# Patient Record
Sex: Male | Born: 1955
Health system: Southern US, Community
[De-identification: ages and names within clinical notes are randomized; demographics above are authoritative.]

## PROBLEM LIST (undated history)

## (undated) DIAGNOSIS — K859 Acute pancreatitis without necrosis or infection, unspecified: Secondary | ICD-10-CM

## (undated) DIAGNOSIS — G709 Myoneural disorder, unspecified: Secondary | ICD-10-CM

## (undated) DIAGNOSIS — T7840XA Allergy, unspecified, initial encounter: Secondary | ICD-10-CM

## (undated) DIAGNOSIS — G600 Hereditary motor and sensory neuropathy: Secondary | ICD-10-CM

## (undated) HISTORY — DX: Acute pancreatitis without necrosis or infection, unspecified: K85.90

## (undated) HISTORY — DX: Myoneural disorder, unspecified: G70.9

## (undated) HISTORY — PX: HERNIA REPAIR: SHX51

## (undated) HISTORY — PX: HERNIA MESH REMOVAL: SHX1745

## (undated) HISTORY — DX: Hereditary motor and sensory neuropathy: G60.0

## (undated) HISTORY — DX: Allergy, unspecified, initial encounter: T78.40XA

## (undated) HISTORY — PX: OTHER SURGICAL HISTORY: SHX169

## (undated) HISTORY — PX: VASECTOMY: SHX75

---

## 2004-01-23 ENCOUNTER — Emergency Department (HOSPITAL_COMMUNITY): Admission: EM | Admit: 2004-01-23 | Discharge: 2004-01-23 | Payer: Self-pay | Admitting: Emergency Medicine

## 2009-02-03 ENCOUNTER — Ambulatory Visit: Payer: Self-pay | Admitting: Family Medicine

## 2009-02-03 ENCOUNTER — Inpatient Hospital Stay (HOSPITAL_COMMUNITY): Admission: EM | Admit: 2009-02-03 | Discharge: 2009-02-06 | Payer: Self-pay | Admitting: Emergency Medicine

## 2009-11-24 IMAGING — US US ABDOMEN COMPLETE
1 series · 14 of 25 positions shown · non-contrast
Comparison: None

CLINICAL DATA: Abdominal pain.  Right upper quadrant pain.

ABDOMEN ULTRASOUND
TECHNIQUE: Complete abdominal ultrasound examination was performed
including evaluation of the liver, gallbladder, bile ducts,
pancreas, kidneys, spleen, IVC, and abdominal aorta.

[Series 1: us abdomen complete · 0.28mm/px · 14 of 54 slices shown]
[im 1/54]
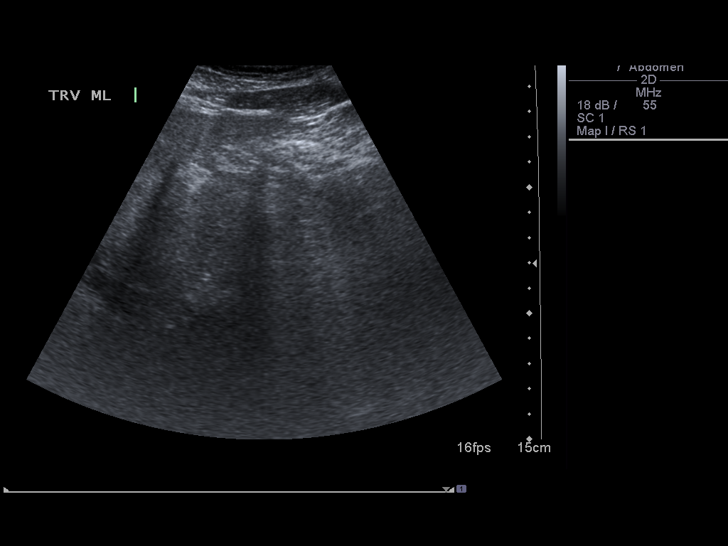
[im 5/54]
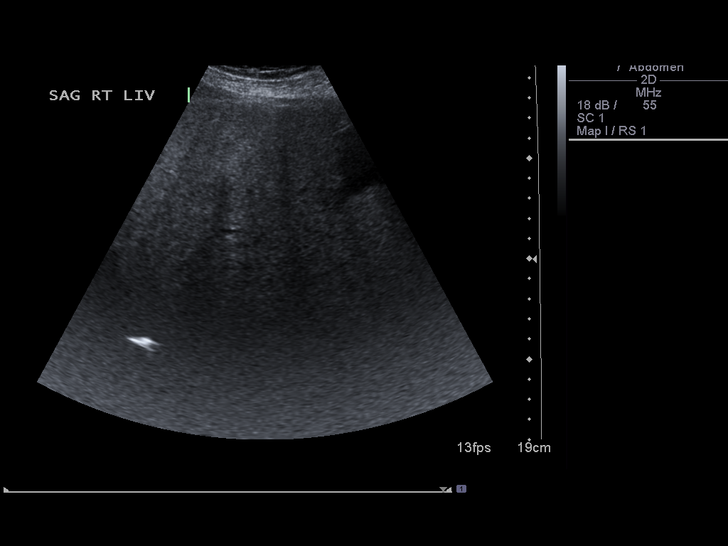
[im 9/54]
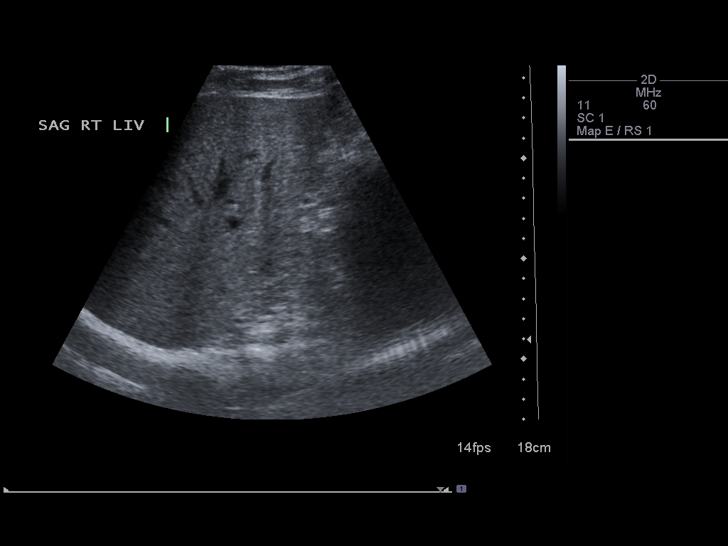
[im 14/54]
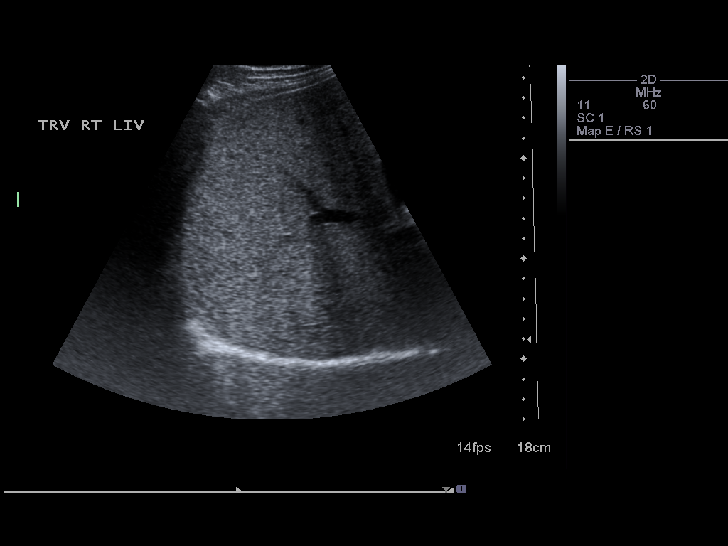
[im 18/54]
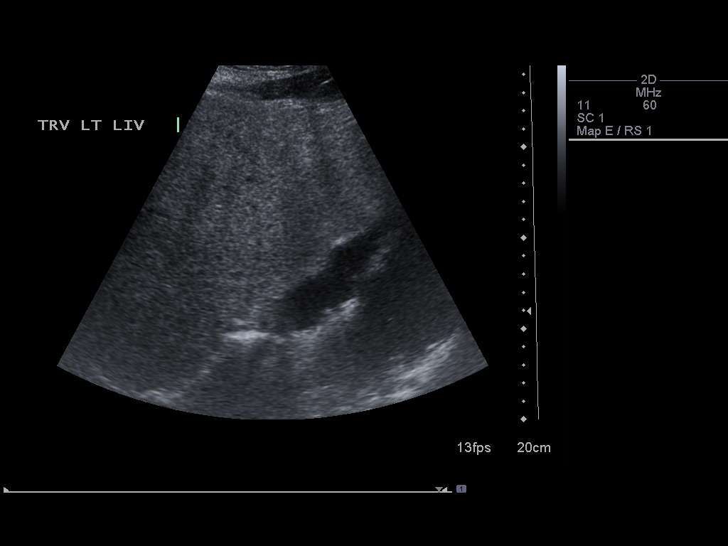
[im 20/54]
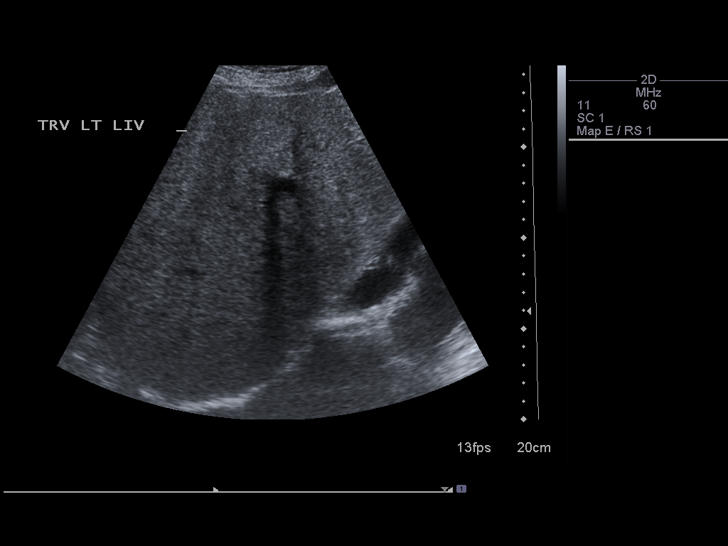
[im 25/54]
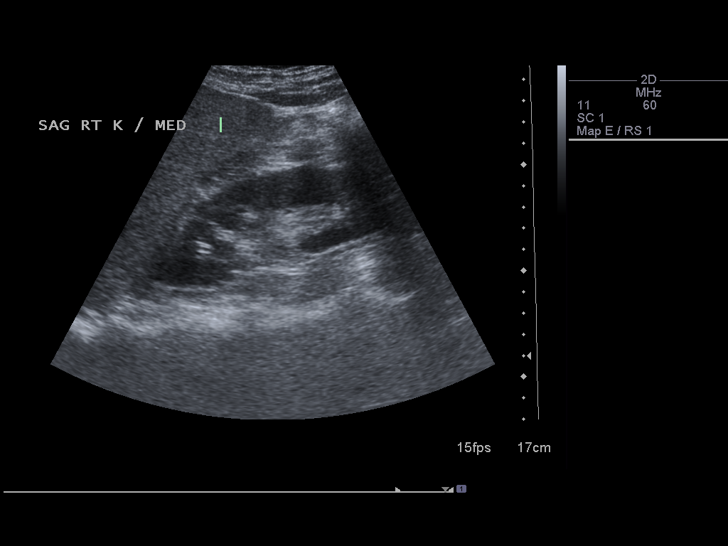
[im 29/54]
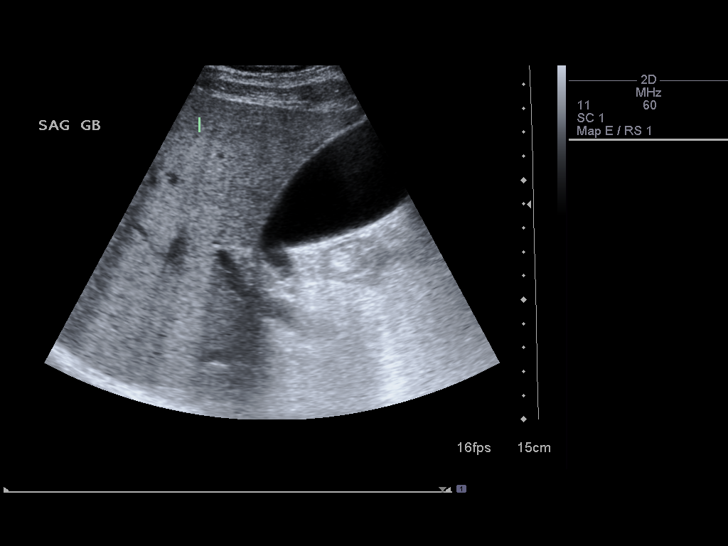
[im 34/54]
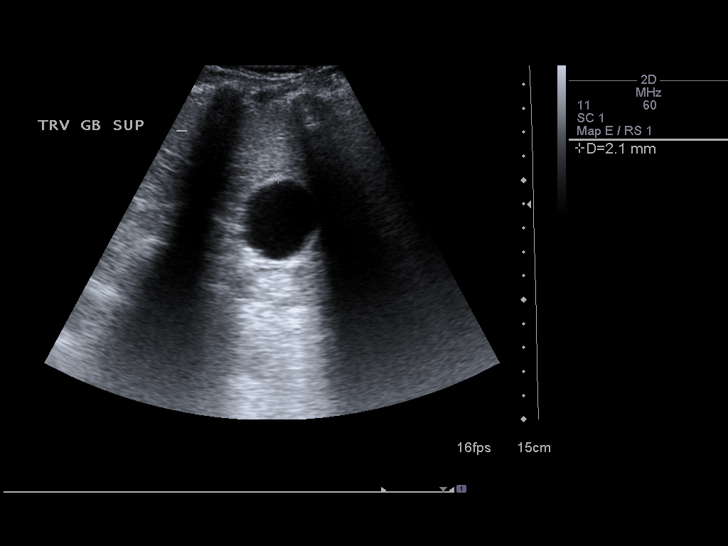
[im 36/54]
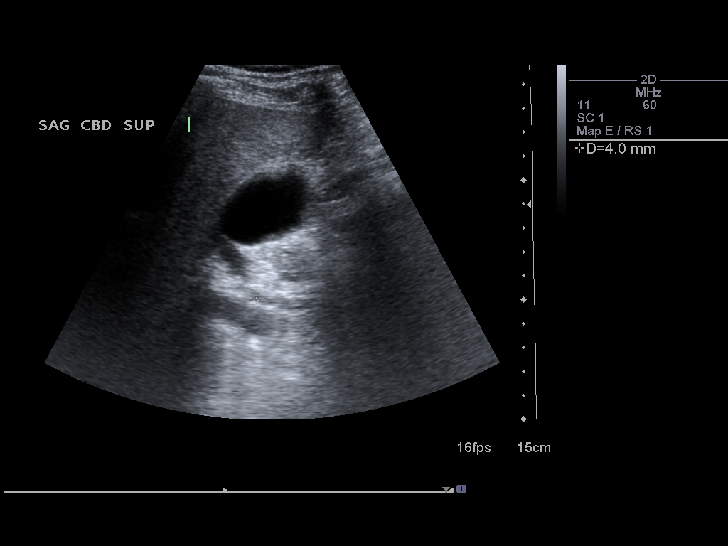
[im 40/54]
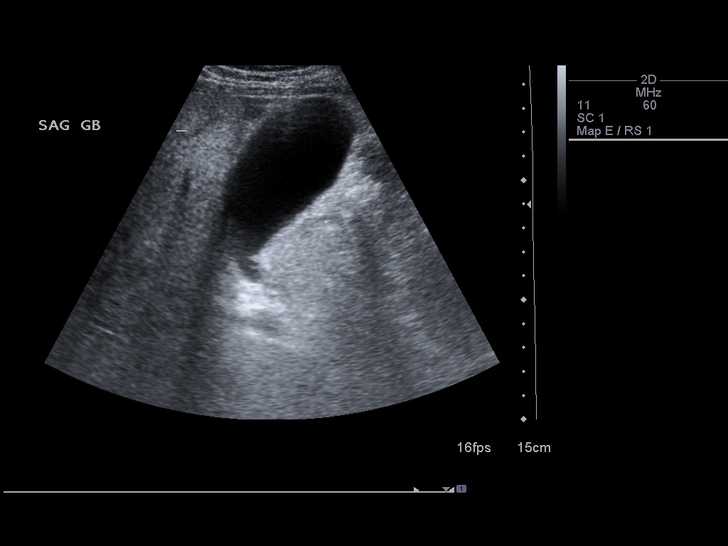
[im 45/54]
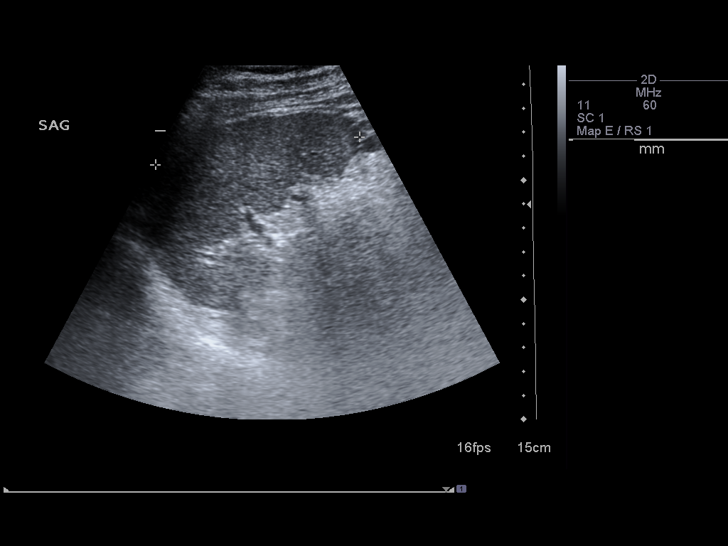
[im 49/54]
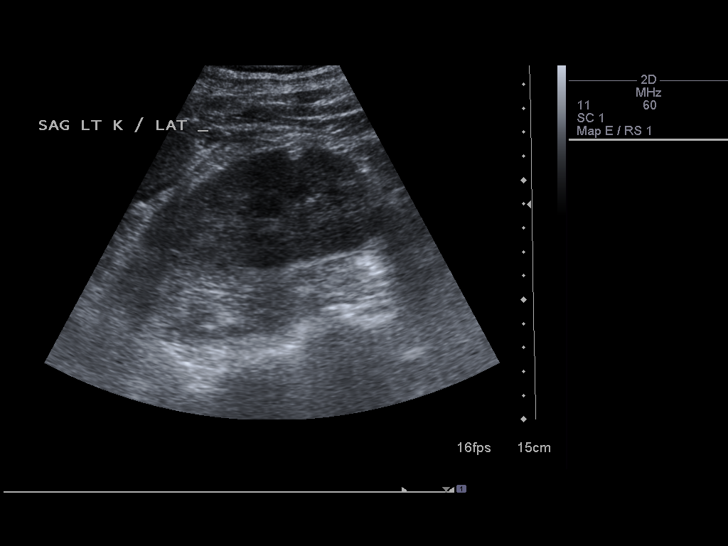
[im 54/54]
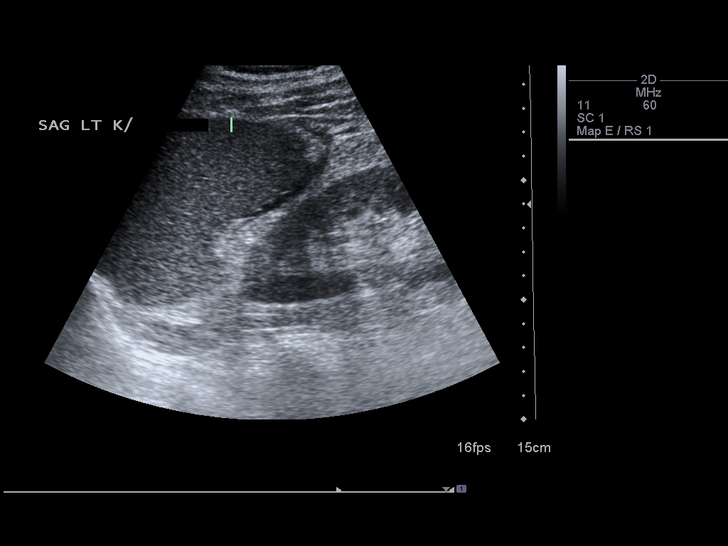

[14 of 25 positions shown; findings below may reference images not displayed]

FINDINGS: Gallbladder:  No gallstones, gallbladder wall thickening, or
pericholecystic fluid.

Common bile duct: Within normal limits in caliber.

Liver:  The liver is echogenic, consistent fatty infiltration.  No
focal abnormalities identified.

Inferior vena cava:  Not visualized because of bowel gas.

Pancreas:  Non-visualized because of bowel gas.

Spleen:  Within normal limits in size and echogenicity.

Right kidney:  Within normal limits in size and echogenicity. No
evidence of mass or hydronephrosis.

Left kidney:  Within normal limits in size and echogenicity. No
evidence of mass or hydronephrosis.

Abdominal aorta:   Non-visualized because of bowel gas.
IMPRESSION: 1.  Fatty infiltration of the liver.
2.  No evidence for acute abnormality in the abdomen.

## 2009-11-25 IMAGING — CR DG ABDOMEN 1V
1 series · 1 of 1 positions shown · non-contrast
Comparison: None

CLINICAL DATA: Pancreatitis, abdominal pain

ABDOMEN - 1 VIEW

[t abdomen supine]
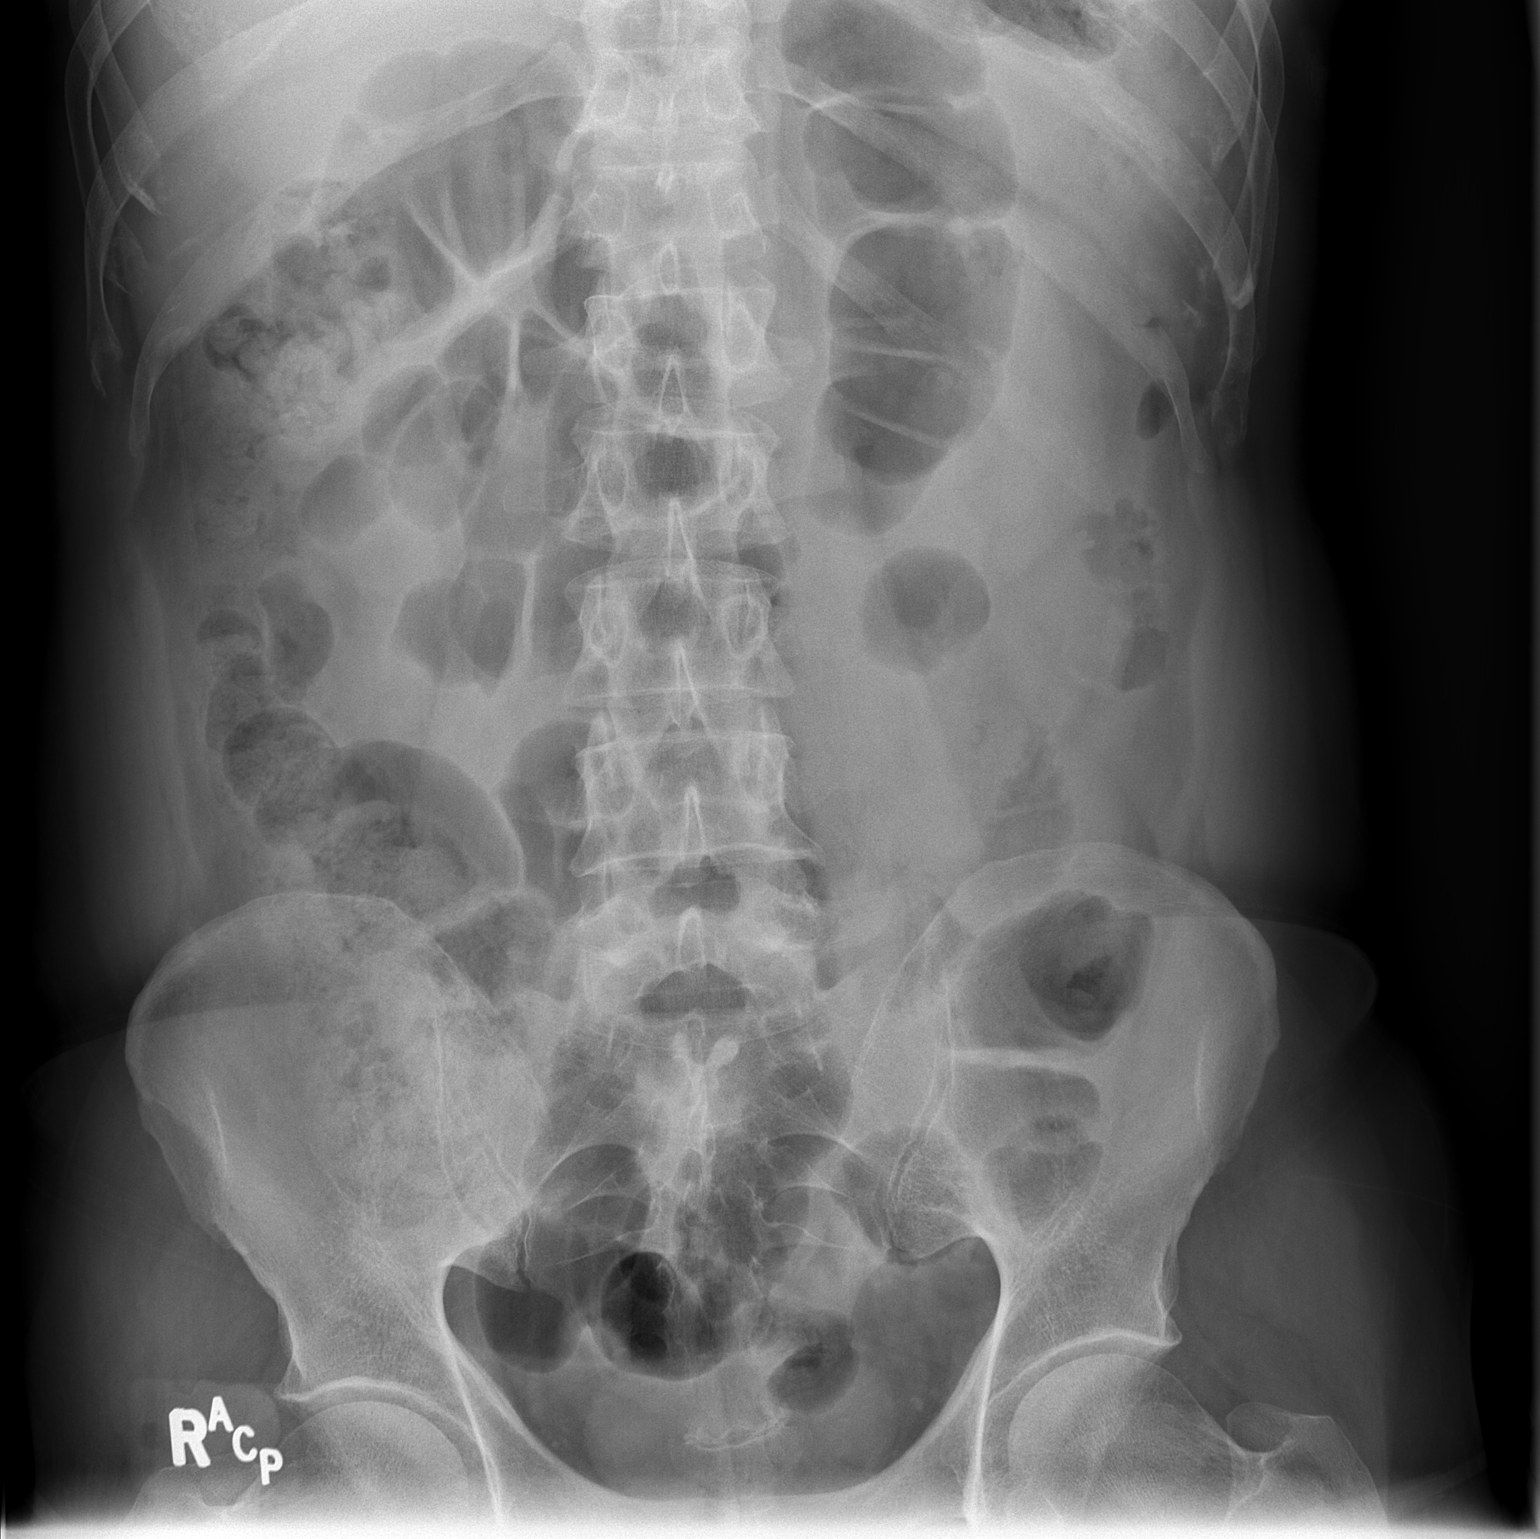

[1 of 1 positions shown; findings below may reference images not displayed]

FINDINGS: There are a few gas distended small bowel loops in the
mid abdomen.  Relatively normal distribution of gas and stool
throughout the colon which is decompressed distally.  Visualized
bones unremarkable.  No abnormal abdominal calcifications.
IMPRESSION: Nonspecific bowel gas pattern with some distended mid abdominal
small bowel loops.  If symptoms persist, CT may be useful for
further evaluation.

## 2011-03-14 LAB — CBC
HCT: 43.4 % (ref 39.0–52.0)
HCT: 44.4 % (ref 39.0–52.0)
Hemoglobin: 15.7 g/dL (ref 13.0–17.0)
Hemoglobin: 15.8 g/dL (ref 13.0–17.0)
MCHC: 35.4 g/dL (ref 30.0–36.0)
MCHC: 35.5 g/dL (ref 30.0–36.0)
MCHC: 36.3 g/dL — ABNORMAL HIGH (ref 30.0–36.0)
MCV: 95.3 fL (ref 78.0–100.0)
MCV: 96.9 fL (ref 78.0–100.0)
MCV: 96.9 fL (ref 78.0–100.0)
Platelets: 171 10*3/uL (ref 150–400)
Platelets: 181 10*3/uL (ref 150–400)
Platelets: 193 10*3/uL (ref 150–400)
Platelets: 203 10*3/uL (ref 150–400)
RBC: 4.56 MIL/uL (ref 4.22–5.81)
RBC: 4.58 MIL/uL (ref 4.22–5.81)
RDW: 12.7 % (ref 11.5–15.5)
RDW: 12.7 % (ref 11.5–15.5)
RDW: 12.9 % (ref 11.5–15.5)
WBC: 10.7 10*3/uL — ABNORMAL HIGH (ref 4.0–10.5)
WBC: 9 10*3/uL (ref 4.0–10.5)
WBC: 9.1 10*3/uL (ref 4.0–10.5)

## 2011-03-14 LAB — LIPID PANEL
Cholesterol: 172 mg/dL (ref 0–200)
LDL Cholesterol: 95 mg/dL (ref 0–99)
Total CHOL/HDL Ratio: 3.3 RATIO
Triglycerides: 125 mg/dL (ref <150)
VLDL: 25 mg/dL (ref 0–40)

## 2011-03-14 LAB — COMPREHENSIVE METABOLIC PANEL WITH GFR
ALT: 67 U/L — ABNORMAL HIGH (ref 0–53)
AST: 39 U/L — ABNORMAL HIGH (ref 0–37)
Albumin: 3.4 g/dL — ABNORMAL LOW (ref 3.5–5.2)
Alkaline Phosphatase: 74 U/L (ref 39–117)
BUN: 9 mg/dL (ref 6–23)
CO2: 28 meq/L (ref 19–32)
Calcium: 8.4 mg/dL (ref 8.4–10.5)
Chloride: 102 meq/L (ref 96–112)
Creatinine, Ser: 0.73 mg/dL (ref 0.4–1.5)
GFR calc non Af Amer: >60 mL/min
Glucose, Bld: 107 mg/dL — ABNORMAL HIGH (ref 70–99)
Potassium: 4 meq/L (ref 3.5–5.1)
Sodium: 137 meq/L (ref 135–145)
Total Bilirubin: 1.5 mg/dL — ABNORMAL HIGH (ref 0.3–1.2)
Total Protein: 6.3 g/dL (ref 6.0–8.3)

## 2011-03-14 LAB — LACTATE DEHYDROGENASE: LDH: 127 U/L (ref 94–250)

## 2011-03-14 LAB — COMPREHENSIVE METABOLIC PANEL
AST: 98 U/L — ABNORMAL HIGH (ref 0–37)
Albumin: 3.2 g/dL — ABNORMAL LOW (ref 3.5–5.2)
Albumin: 4 g/dL (ref 3.5–5.2)
Alkaline Phosphatase: 67 U/L (ref 39–117)
Alkaline Phosphatase: 82 U/L (ref 39–117)
BUN: 7 mg/dL (ref 6–23)
Calcium: 8.7 mg/dL (ref 8.4–10.5)
Chloride: 100 mEq/L (ref 96–112)
Creatinine, Ser: 0.73 mg/dL (ref 0.4–1.5)
GFR calc Af Amer: >60 mL/min (ref >60)
Glucose, Bld: 76 mg/dL (ref 70–99)
Potassium: 4 mEq/L (ref 3.5–5.1)
Potassium: 4.2 mEq/L (ref 3.5–5.1)
Sodium: 137 mEq/L (ref 135–145)
Total Bilirubin: 1.2 mg/dL (ref 0.3–1.2)
Total Protein: 6.7 g/dL (ref 6.0–8.3)

## 2011-03-14 LAB — BASIC METABOLIC PANEL
CO2: 30 mEq/L (ref 19–32)
Chloride: 101 mEq/L (ref 96–112)
Creatinine, Ser: 0.75 mg/dL (ref 0.4–1.5)
GFR calc Af Amer: >60 mL/min (ref >60)
Potassium: 3.9 mEq/L (ref 3.5–5.1)

## 2011-03-14 LAB — URINALYSIS, ROUTINE W REFLEX MICROSCOPIC
Bilirubin Urine: NEGATIVE
Glucose, UA: NEGATIVE mg/dL
Hgb urine dipstick: NEGATIVE
Ketones, ur: NEGATIVE mg/dL
Nitrite: NEGATIVE
Protein, ur: NEGATIVE mg/dL
Specific Gravity, Urine: 1.018 (ref 1.005–1.030)
Urobilinogen, UA: 1 mg/dL (ref 0.0–1.0)
pH: 6 (ref 5.0–8.0)

## 2011-03-14 LAB — DIFFERENTIAL
Basophils Absolute: 0 10*3/uL (ref 0.0–0.1)
Basophils Relative: 0 % (ref 0–1)
Eosinophils Absolute: 0 10*3/uL (ref 0.0–0.7)
Eosinophils Relative: 0 % (ref 0–5)
Lymphocytes Relative: 5 % — ABNORMAL LOW (ref 12–46)
Lymphs Abs: 0.6 10*3/uL — ABNORMAL LOW (ref 0.7–4.0)
Monocytes Absolute: 0.5 10*3/uL (ref 0.1–1.0)
Monocytes Relative: 5 % (ref 3–12)
Neutro Abs: 9.6 10*3/uL — ABNORMAL HIGH (ref 1.7–7.7)
Neutrophils Relative %: 90 % — ABNORMAL HIGH (ref 43–77)

## 2011-03-14 LAB — RAPID URINE DRUG SCREEN, HOSP PERFORMED
Barbiturates: NOT DETECTED
Benzodiazepines: NOT DETECTED
Cocaine: NOT DETECTED
Opiates: POSITIVE — AB

## 2011-03-14 LAB — ETHANOL

## 2011-03-14 LAB — LIPASE, BLOOD
Lipase: 195 U/L — ABNORMAL HIGH (ref 11–59)
Lipase: 3045 U/L — ABNORMAL HIGH (ref 11–59)

## 2011-04-16 NOTE — H&P (Signed)
NAME:  Chad Kelley, Chad Kelley NO.:  [id number]   MEDICAL RECORD NO.:  [id number]          PATIENT TYPE:  INP   LOCATION:  5502                         FACILITY:  [facility]   PHYSICIAN:  Paula Compton, MD        DATE OF BIRTH:  May 02, 1956   DATE OF ADMISSION:  02/03/2009  DATE OF DISCHARGE:                              HISTORY & PHYSICAL   PRIMARY CARE PHYSICIAN:  Dr. Merla Riches at [facility].   CHIEF COMPLAINT:  Acute pancreatitis.   HISTORY OF PRESENT ILLNESS:  A 55 year old male with 3 days of  intermittent right epigastric abdominal pain that is radiating to his  back that approximately 36 hours ago being vomiting and unable to eat  and his pain became constant, so he presented to the emergency room.  He  had approximately 1 episode similar to this in the past approximately 1  year ago that was treated as an outpatient and never diagnosed as  pancreatitis.  He is a regular drinker, of note, of approximately a 6-  pack per night.  He thinks this flare, however, may have been started by  eating fatty foods.  He notes that his last drink was approximately 2  days ago.  He denies any history of DTs.  He has tried Alka-Seltzer or  Mylanta at home without any help.   PAST MEDICAL HISTORY:  1. Depression which he calls temper management.  2. GERD with a questionable history of hiatal hernia.  3. 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  4. Torticollis on the right.  5. Seasonal allergies.   PAST SURGICAL HISTORY:  Hernia operation and tonsillectomy.   SOCIAL HISTORY:  The patient is married and has two dogs, one daughter.  He drinks approximately 6 drinks a night that he admits to.  He  occasionally smokes cigars.  He denies any illicit drugs.  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   FAMILY HISTORY:  His mother had Charcot-Marie-Tooth disease as well as  diabetes and obesity.  Father had  hypertension and non-Hodgkin lymphoma,  now in remission, has 1 brother who is healthy as well as his 1  daughter.   CURRENT MEDICATIONS:  1. Clonazepam 0.5 mg p.o. b.i.d. p.r.n.  2. Lexapro 5 mg p.o. daily.  3. Nexium 40 mg p.o. p.r.n.   No known drug allergies.   REVIEW OF SYSTEMS:  Negative except per HPI.   PHYSICAL EXAMINATION:  VITAL SIGNS:  Respirations 20, pulse 63, blood  pressure 118/73, O2 of 99% on room air, and T-current 97.7.  GENERAL:  This is a pleasant white male in no apparent distress.  HEENT:  Normocephalic and atraumatic with poor dentition.  No  lymphadenopathy.  Pupils are equal, round, and reactive to light.  Extraocular muscles intact and sclerae clear.  CARDIOVASCULAR:  Regular rate and rhythm.  No murmurs, rubs, or gallops.  PULMONARY:  Clear to auscultation bilaterally with normal work of  breathing.  ABDOMEN:  Soft with no rebound or guarding.  Liver edge is palpable  approximately 3 cm below the costal margin.  He is tender in epigastrium  and right upper quadrant.  EXTREMITIES:  Questionable early Dupuytren contracture on the right  hand.  There is muscle wasting of the hands and calves bilaterally.  NEURO:  The patient is alert and oriented x3.   STUDIES:  1. Right upper quadrant ultrasound shows no gallstones or wall      thickening or fluid.  Common bile duct is within normal limits, but      we are unable to visualize the pancreas.  There are signs of fatty      liver.  The kidneys are normal bilaterally as well as the spleen.  2. Chest x-ray reveals shallow lung volumes and a mild elevation of      the right hemidiaphragm, but no acute disease.   LABORATORY DATA:  Lipase is 3045.  Sodium 134, potassium 4.0, chloride  100, CO2 of 25, BUN 11, creatinine 0.73, glucose 111, total bilirubin  1.2, alkaline phosphatase 82, AST 98, ALT 91, total protein 7.1, albumin  4.0, and calcium 8.9.  White blood cell count 10.7, hemoglobin 17.3,  hematocrit  48.2, and platelet count 203 with 90% neutrophils.   ASSESSMENT:  This is a 55 year old male with acute pancreatitis.   PLAN:  1. Pancreatitis.  Most likely etiology of this is alcohol particularly      with a normal right upper quadrant ultrasound as noted above.      There are no offending drugs on his medication list.  We will check      a fasting lipid panel to rule out elevated triglycerides as the      possible cause though this seems unlikely as well.  There is no      recent trauma to be a source either.  For now, we will hydrate the      patient and keep him n.p.o.  We will provide Dilaudid and Zofran      for pain and nausea respectively.  We will start Zosyn for gut      decontamination.  His Ransom  score is currently less than 2, so      there is a low risk of mortality from this at this time.  The      patient is aware that likely his pancreas is secondary to alcohol      use and he plans of quitting at this time.  He will likely need a      CT in a few days to further assess his pancreas to make sure no      pseudocyst is formed.  For today, we will check a KUB as he has a      history of similar pain to make sure this is not more chronic.  For      now, we will follow his labs and clinical status closely.  2. Alcohol.  We will monitor him closely for withdrawal, though it      seems unlikely given that he has no history.  3. Prophylaxis.  We will provide the patient with a PPI and subcu      heparin.  4. FEN.  Electrolytes are currently stable.  We will run IV fluids of      normal saline at 250 mL an hour.  Again, we will keep the patient      n.p.o. except for ice chips.  5. Disposition is pending clinical improvement.  Ancil Boozer, MD  Electronically Signed      Paula Compton, MD  Electronically Signed    SA/MEDQ  D:  02/04/2009  T:  02/05/2009  Job:  [id number]

## 2011-04-19 NOTE — Discharge Summary (Signed)
NAME:  Chad Kelley, BOODRAM NO.:  [id number]   MEDICAL RECORD NO.:  [id number]          PATIENT TYPE:  INP   LOCATION:  5502                         FACILITY:  [facility]   PHYSICIAN:  Paula Compton, MD        DATE OF BIRTH:  18-Jan-1956   DATE OF ADMISSION:  02/03/2009  DATE OF DISCHARGE:  02/06/2009                               DISCHARGE SUMMARY   DISCHARGE DIAGNOSES:  1. Acute pancreatitis.  2. Alcohol abuse.  3. Depression.  4. Gastroesophageal reflux disease.  5. Charcot-Marie-Tooth syndrome.  6. Torticollis.  7. Hiatal hernia.   DISCHARGE MEDICATIONS:  1. Clonazepam 0.5 mg p.o. b.i.d.  2. Lexapro 5 mg p.o. daily.  3. Nexium 40 mg p.o. daily.  4. Tylenol [phone] mg q.6 hours p.r.n. pain.   DISCONTINUED MEDICATIONS:  None.   CONSULTATIONS:  None.   PROCEDURES:  None.   LABORATORY DATA:  Admission lipase 3045, discharge lipase 136.  CBC:  Hemoglobin 16.8, hematocrit 43.4, platelets 193, and white count 9.1.  BMET:  Sodium 134, potassium 3.9, glucose 105, BUN 7, and creatinine  0.75.  Lipid panel:  Total cholesterol 172, triglycerides 125, HDL 52,  and LDL 95.  Urine drug screen positive for opiates which were given  during admission.  EtOH level less than 5.  Urinalysis negative.   IMAGING:  Chest x-ray, February 03, 2009, impression:  No acute  cardiopulmonary abnormality.  Abdominal ultrasound, February 03, 2009,  impression:  Fatty infiltration of the liver.  No evidence of acute  abnormality in the abdomen.   Abdominal x-ray, February 04, 2009, impression:  Nonspecific bowel gas  pattern with some distended mid abdominal small bowel loops.   BRIEF HOSPITAL COURSE:  A 55 year old male admitted with 3 days of  intermittent right epigastric abdominal pain radiating to the back with  associated nausea and vomiting.  The patient has a history of alcohol  abuse approximately a 6-pack per night and had associated initial  episode of pain with eating fatty foods.  The  patient was admitted with  concern for acute pancreatitis with a lipase of 3045 upon admission as  well as elevated liver enzymes.  1. Acute pancreatitis.  The patient was admitted with acute      pancreatitis with elevated lipase on admission.  Imaging did not      show any other evidence of acute abdominal process.  The patient's      most likely etiology for pancreatitis is alcohol with history of      alcohol abuse and normal right upper quadrant ultrasound.  UDS did      not show any abnormalities and fasting lipid panel was within      normal limits.  The patient also did not have any recent trauma to      abdomen and did not have any history of gallstones which were not      shown on ultrasound to suggest gallstone pancreatitis.  The patient      was admitted and kept n.p.o. and hydrated with IV pain medication  and IV antiemetics.  The patient was also given approximately 48      hours of Zosyn for gut decontamination.  It is of note the patient      was afebrile during admission without significant white count.  On      hospital day #3, the patient's diet was advanced starting with      liquids and was tolerating full diet prior to discharge.  The      patient did not require any pain medications the day of discharge      as well or use of any antiemetics.  The patient's lipase was      trended during admission and was quickly resolving.  At discharge,      the patient's lipase was 136, which was a vast improvement from      admission.  Prior to discharge, the patient was without abdominal      pain and if the primary team decided, the patient would be stable      to go home.  The patient was given a handout and information on      proper diet to avoid such occurrence as well as alcohol resources      for cessation.  Of note, the patient did have episodes of diarrhea      during admission which was most likely secondary to Zosyn use.  2. Alcohol abuse.  Social worker was  consulted to provide the patient      with resources regarding alcohol cessation prior to discharge.      Primary care Roniesha Hollingshead to follow up with alcohol cessation if the      patient is motivated.  3. GERD.  The patient with history of GERD was given Protonix for GERD      and GI prophylaxis during admission.  We will continue Nexium p.o.      daily at discharge.  4. Depression.  The patient with history of depression was continued      on home medications of clonazepam and Lexapro at discharge.   DISCHARGE INSTRUCTIONS:  The patient is to return if he develops  increased abdominal pain, fever, or difficulty breathing.  The patient  is to followup with primary care Denetria Luevanos status post hospitalization  and for evaluation of diarrhea if persists.   DIET:  The patient is to have low-fat diet.  The patient was also given  resources regarding alcohol cessation.   ISSUES FOR FOLLOWUP:  Overall well being status post admission.   DISCHARGE CONDITION:  Stable/improve.   DISCHARGE LOCATION:  Home.      Milinda Antis, MD  Electronically Signed      Paula Compton, MD  Electronically Signed    KD/MEDQ  D:  02/07/2009  T:  02/08/2009  Job:  [id number]   cc:   Harrel Lemon. Merla Riches, M.D.

## 2013-07-07 ENCOUNTER — Ambulatory Visit (INDEPENDENT_AMBULATORY_CARE_PROVIDER_SITE_OTHER): Payer: BC Managed Care – PPO | Admitting: Emergency Medicine

## 2013-07-07 VITALS — BP 118/72 | HR 74 | Temp 97.7°F | Resp 16 | Ht 67.0 in | Wt 155.0 lb

## 2013-07-07 DIAGNOSIS — L309 Dermatitis, unspecified: Secondary | ICD-10-CM

## 2013-07-07 DIAGNOSIS — C44309 Unspecified malignant neoplasm of skin of other parts of face: Secondary | ICD-10-CM

## 2013-07-07 DIAGNOSIS — L259 Unspecified contact dermatitis, unspecified cause: Secondary | ICD-10-CM

## 2013-07-07 DIAGNOSIS — C44319 Basal cell carcinoma of skin of other parts of face: Secondary | ICD-10-CM

## 2013-07-07 MED ORDER — TRIAMCINOLONE 0.1 % CREAM:EUCERIN CREAM 1:1: 1.0000 "application " | TOPICAL_CREAM | Freq: Two times a day (BID) | CUTANEOUS | Status: DC | PRN

## 2013-07-07 NOTE — Progress Notes (Signed)
  Subjective:    Patient ID: Chad Kelley, male    DOB: [birth year], 57 y.o.   MRN: [id number]  HPI  56y.o. Male presents to clinic today with complaints on scratch to top left forehead x 5 weeks. First noticed area over a year ago. Is also having trouble with dry skin. States that he has allergies to many soaps and it unsure of what to do for it .  Has concerns about a area on the left side of his face he would like to have checked .  Review of Systems     Objective:   Physical Exam 1 x 1 cm ulcerated area right frontal area with central crater formation. The skin on both legs are dry and cracked        Assessment & Plan:  Patient has a probable basal cell on his scalp. Referral is made to dermatology for this. We'll give him eucerin/ triamcinolone mix for the rash on his legs

## 2014-06-29 ENCOUNTER — Telehealth: Payer: Self-pay

## 2014-06-29 NOTE — Telephone Encounter (Signed)
Per chelle pt needs to RTC for eval.  lmom for pt to cb

## 2014-06-29 NOTE — Telephone Encounter (Signed)
Pt called in and states that he needs his [facility]. He has about a week left.  He can be reached [phone]. Thank you

## 2014-07-06 NOTE — Telephone Encounter (Signed)
Spoke with pt. He said to disregard the message. He had need the RF of the script we wrote at his last OV [facility], but he said he ended up just picking it up at his pharm.

## 2014-07-06 NOTE — Telephone Encounter (Signed)
LM with co worker for call back

## 2014-12-27 ENCOUNTER — Ambulatory Visit (INDEPENDENT_AMBULATORY_CARE_PROVIDER_SITE_OTHER): Payer: BLUE CROSS/BLUE SHIELD | Admitting: Internal Medicine

## 2014-12-27 VITALS — BP 112/64 | HR 63 | Temp 98.1°F | Resp 18 | Ht 67.25 in | Wt 154.2 lb

## 2014-12-27 DIAGNOSIS — J329 Chronic sinusitis, unspecified: Secondary | ICD-10-CM

## 2014-12-27 DIAGNOSIS — J302 Other seasonal allergic rhinitis: Secondary | ICD-10-CM | POA: Insufficient documentation

## 2014-12-27 MED ORDER — AMOXICILLIN 875 MG PO TABS: 875.0000 mg | ORAL_TABLET | Freq: Two times a day (BID) | ORAL | Status: DC

## 2014-12-27 MED ORDER — FLUTICASONE PROPIONATE 50 MCG/ACT NA SUSP: NASAL | Status: DC

## 2014-12-27 NOTE — Progress Notes (Signed)
   Subjective:    Patient ID: Chad Kelley, male    DOB: 12-Dec-1955, 59 y.o.   MRN: [id number]  HPI complaining of sneezing, congestion, rhinorrhea, early morning cough clearing of mucus since November 2015 Lots of afternoon fatigue No fever No weight loss Chest feels heavy in the morning but no wheezing  History of recurrent sinus infections History of frequent sneezing in the fall and spring    Review of Systems    noncontributory Objective:   Physical Exam BP 112/64 mmHg  Pulse 63  Temp(Src) 98.1 F (36.7 C) (Oral)  Resp 18  Ht 5' 7.25" (1.708 m)  Wt 154 lb 3.2 oz (69.945 kg)  BMI 23.98 kg/m2  SpO2 97% Alert in no acute distress Conjunctiva clear/TMs clear, canals with lots of dry scaly skin Nares with boggy turbinates and purulent rhinorrhea Chest clear to auscultation       Assessment & Plan:  Recurrent sinusitis  Other seasonal allergic rhinitis  Meds ordered this encounter  Medications  . amoxicillin (AMOXIL) 875 MG tablet    Sig: Take 1 tablet (875 mg total) by mouth 2 (two) times daily.    Dispense:  20 tablet    Refill:  0  . fluticasone (FLONASE) 50 MCG/ACT nasal spray    Sig: 1 spray each nostril twice a day    Dispense:  16 g    Refill:  11   Follow-up one to 2 months if not well

## 2015-01-12 ENCOUNTER — Other Ambulatory Visit: Payer: Self-pay

## 2015-01-12 MED ORDER — FLUTICASONE PROPIONATE 50 MCG/ACT NA SUSP: NASAL | Status: DC

## 2015-10-30 ENCOUNTER — Encounter: Payer: Self-pay | Admitting: Internal Medicine

## 2015-12-18 ENCOUNTER — Other Ambulatory Visit: Payer: Self-pay | Admitting: Internal Medicine

## 2017-05-12 ENCOUNTER — Telehealth: Payer: Self-pay | Admitting: Family Medicine

## 2017-05-12 NOTE — Telephone Encounter (Signed)
Pt scheduled new pt appt first available Aug 8th. Pt PCP Doolittle retired. Pt has not had CPE in years. Pt would like to have initial appt to be new pt get established and CPE if possible. If not, can he get an earlier "new pt" appt to get established.

## 2017-05-14 ENCOUNTER — Telehealth: Payer: Self-pay | Admitting: Family Medicine

## 2017-05-14 NOTE — Telephone Encounter (Signed)
Pt will need to est care first. If there is an open spot for an earlier "new pt" appt pt can be moved. If not then pt can keep the first available new pt appt.

## 2017-05-14 NOTE — Telephone Encounter (Signed)
Called pt offered new pt appt 8/8 instead of 8/13 he already has scheduled. Pt is fine to wait. He prefers Mondays which is 07/14/17. Explained est new pt visit then schedule CPE at checkout. Pt verbalized understanding and compliance with no addtnl concerns.

## 2017-07-07 ENCOUNTER — Encounter: Payer: Self-pay | Admitting: Emergency Medicine

## 2017-07-07 ENCOUNTER — Ambulatory Visit (INDEPENDENT_AMBULATORY_CARE_PROVIDER_SITE_OTHER): Payer: BLUE CROSS/BLUE SHIELD | Admitting: Emergency Medicine

## 2017-07-07 VITALS — BP 135/78 | HR 71 | Temp 98.4°F | Resp 16 | Ht 66.0 in | Wt 158.0 lb

## 2017-07-07 DIAGNOSIS — G600 Hereditary motor and sensory neuropathy: Secondary | ICD-10-CM

## 2017-07-07 DIAGNOSIS — Z0001 Encounter for general adult medical examination with abnormal findings: Secondary | ICD-10-CM

## 2017-07-07 MED ORDER — ESCITALOPRAM OXALATE 10 MG PO TABS: 10.0000 mg | ORAL_TABLET | Freq: Every day | ORAL | 5 refills | Status: DC

## 2017-07-07 NOTE — Progress Notes (Signed)
Chad Kelley 61 y.o.   Chief Complaint  Patient presents with  . Annual Exam    HISTORY OF PRESENT ILLNESS: This is a 61 y.o. male here for annual exam; has no complaints; has h/o Charcot-Marie-Tooth peripheral neuropathy under care at [facility]; also requesting to be put back on Lexapro.  HPI   Prior to Admission medications   Medication Sig Start Date End Date Taking? Authorizing Provider  clobetasol ointment (TEMOVATE) 1.88 % Apply 1 application topically 2 (two) times daily.   Yes [provider]  fluticasone (FLONASE) 50 MCG/ACT nasal spray 1 spray each nostril twice a day 01/12/15  Yes Chad Koyanagi, MD  amoxicillin (AMOXIL) 875 MG tablet Take 1 tablet (875 mg total) by mouth 2 (two) times daily. Patient not taking: Reported on 07/07/2017 12/27/14   Chad Koyanagi, MD  clonazePAM (KLONOPIN) 0.5 MG tablet Take 0.5 mg by mouth 2 (two) times daily as needed for anxiety.    [provider]  Triamcinolone Acetonide (TRIAMCINOLONE 0.1 % CREAM : EUCERIN) CREA Apply 1 application topically 2 (two) times daily as needed. Patient not taking: Reported on 12/27/2014 07/07/13   Chad Russian, MD    No Known Allergies  Patient Active Problem List   Diagnosis Date Noted  . Recurrent sinusitis 12/27/2014  . Other seasonal allergic rhinitis 12/27/2014    Past Medical History:  Diagnosis Date  . Neuromuscular disorder [facility])     Past Surgical History:  Procedure Laterality Date  . HERNIA MESH REMOVAL    . HERNIA REPAIR    . pancreatitis    . VASECTOMY      Social History   Social History  . Marital status: Single    Spouse name: N/A  . Number of children: N/A  . Years of education: N/A   Occupational History  . Not on file.   Social History Main Topics  . Smoking status: Former Research scientist (life sciences)  . Smokeless tobacco: Never Used  . Alcohol use No  . Drug use: No  . Sexual activity: Yes   Other Topics Concern  . Not on file   Social  History Narrative  . No narrative on file    Family History  Problem Relation Age of Onset  . Diabetes Mother   . Hypertension Father   . Cancer Father      Review of Systems  Constitutional: Negative.  Negative for chills and fever.  HENT: Negative.  Negative for ear pain and sore throat.   Eyes: Negative.  Negative for blurred vision and double vision.  Respiratory: Negative.  Negative for cough and shortness of breath.   Cardiovascular: Negative.  Negative for chest pain and palpitations.  Gastrointestinal: Negative for abdominal pain, blood in stool, melena, nausea and vomiting.  Genitourinary: Negative for dysuria and hematuria.  Skin: Positive for rash (chronic left thigh rash).  Neurological: Positive for sensory change. Negative for dizziness and headaches.  Endo/Heme/Allergies: Negative.   All other systems reviewed and are negative.   Vitals:   07/07/17 0915  BP: 135/78  Pulse: 71  Resp: 16  Temp: 98.4 F (36.9 C)    Physical Exam  Constitutional: He is oriented to person, place, and time. He appears well-developed and well-nourished.  HENT:  Head: Normocephalic and atraumatic.  Nose: Nose normal.  Mouth/Throat: Oropharynx is clear and moist.  Eyes: Pupils are equal, round, and reactive to light. Conjunctivae and EOM are normal.  Neck: Normal range of motion. Neck supple.  No JVD present. No thyromegaly present.  Cardiovascular: Normal rate, regular rhythm and normal heart sounds.   Pulmonary/Chest: Effort normal and breath sounds normal.  Abdominal: Soft. He exhibits no distension. There is no tenderness.  Musculoskeletal: Normal range of motion.  +muscle atrophy to arms and legs with preacher/claw hands and bilateral foot drop.  Lymphadenopathy:    He has no cervical adenopathy.  Neurological: He is alert and oriented to person, place, and time.  Skin: Skin is warm and dry. Capillary refill takes less than 2 seconds. No rash noted.  Psychiatric: He has a  normal mood and affect. His behavior is normal.  Vitals reviewed.    ASSESSMENT & PLAN: Chad Kelley was seen today for annual exam.  Diagnoses and all orders for this visit:  Encounter for general adult medical examination with abnormal findings -     Ambulatory referral to Gastroenterology -     CBC with Differential -     Comprehensive metabolic panel -     Hemoglobin A1c -     Lipid panel -     PSA(Must document that pt has been informed of limitations of PSA testing.) -     TSH -     Hepatitis C antibody screen -     HIV antibody -     Cancel: Ambulatory referral to Gastroenterology  Charcot-Marie-Tooth disease  Other orders -     Cancel: Tdap vaccine greater than or equal to 7yo IM -     escitalopram (LEXAPRO) 10 MG tablet; Take 1 tablet (10 mg total) by mouth daily.     Patient Instructions       IF you received an x-ray today, you will receive an invoice from [facility]. Please contact [facility] at [phone] with questions or concerns regarding your invoice.   IF you received labwork today, you will receive an invoice from [place]. Please contact [facility] at [phone] with questions or concerns regarding your invoice.   Our billing staff will not be able to assist you with questions regarding bills from these companies.  You will be contacted with the lab results as soon as they are available. The fastest way to get your results is to activate your My Chart account. Instructions are located on the last page of this paperwork. If you have not heard from [place] regarding the results in 2 weeks, please contact this office.        Health Maintenance, Male A healthy lifestyle and preventive care is important for your health and wellness. Ask your health care provider about what schedule of regular examinations is right for you. What should I know about weight and diet? Eat a Healthy Diet  Eat plenty of vegetables, fruits, whole grains,  low-fat dairy products, and lean protein.  Do not eat a lot of foods high in solid fats, added sugars, or salt.  Maintain a Healthy Weight Regular exercise can help you achieve or maintain a healthy weight. You should:  Do at least 150 minutes of exercise each week. The exercise should increase your heart rate and make you sweat (moderate-intensity exercise).  Do strength-training exercises at least twice a week.  Watch Your Levels of Cholesterol and Blood Lipids  Have your blood tested for lipids and cholesterol every 5 years starting at 60 years of age. If you are at high risk for heart disease, you should start having your blood tested when you are 60 years old. You may need to have your cholesterol levels checked  more often if: ? Your lipid or cholesterol levels are high. ? You are older than 60 years of age. ? You are at high risk for heart disease.  What should I know about cancer screening? Many types of cancers can be detected early and may often be prevented. Lung Cancer  You should be screened every year for lung cancer if: ? You are a current smoker who has smoked for at least 30 years. ? You are a former smoker who has quit within the past 15 years.  Talk to your health care provider about your screening options, when you should start screening, and how often you should be screened.  Colorectal Cancer  Routine colorectal cancer screening usually begins at 60 years of age and should be repeated every 5-10 years until you are 60 years old. You may need to be screened more often if early forms of precancerous polyps or small growths are found. Your health care provider may recommend screening at an earlier age if you have risk factors for colon cancer.  Your health care provider may recommend using home test kits to check for hidden blood in the stool.  A small camera at the end of a tube can be used to examine your colon (sigmoidoscopy or colonoscopy). This checks for the  earliest forms of colorectal cancer.  Prostate and Testicular Cancer  Depending on your age and overall health, your health care provider may do certain tests to screen for prostate and testicular cancer.  Talk to your health care provider about any symptoms or concerns you have about testicular or prostate cancer.  Skin Cancer  Check your skin from head to toe regularly.  Tell your health care provider about any new moles or changes in moles, especially if: ? There is a change in a mole's size, shape, or color. ? You have a mole that is larger than a pencil eraser.  Always use sunscreen. Apply sunscreen liberally and repeat throughout the day.  Protect yourself by wearing long sleeves, pants, a wide-brimmed hat, and sunglasses when outside.  What should I know about heart disease, diabetes, and high blood pressure?  If you are 59-38 years of age, have your blood pressure checked every 3-5 years. If you are 27 years of age or older, have your blood pressure checked every year. You should have your blood pressure measured twice-once when you are at a hospital or clinic, and once when you are not at a hospital or clinic. Record the average of the two measurements. To check your blood pressure when you are not at a hospital or clinic, you can use: ? An automated blood pressure machine at a pharmacy. ? A home blood pressure monitor.  Talk to your health care provider about your target blood pressure.  If you are between 24-3 years old, ask your health care provider if you should take aspirin to prevent heart disease.  Have regular diabetes screenings by checking your fasting blood sugar level. ? If you are at a normal weight and have a low risk for diabetes, have this test once every three years after the age of 69. ? If you are overweight and have a high risk for diabetes, consider being tested at a younger age or more often.  A one-time screening for abdominal aortic aneurysm (AAA)  by ultrasound is recommended for men aged 6-75 years who are current or former smokers. What should I know about preventing infection? Hepatitis B If you have a higher  risk for hepatitis B, you should be screened for this virus. Talk with your health care provider to find out if you are at risk for hepatitis B infection. Hepatitis C Blood testing is recommended for:  Everyone born from [birth year] through 1964.  Anyone with known risk factors for hepatitis C.  Sexually Transmitted Diseases (STDs)  You should be screened each year for STDs including gonorrhea and chlamydia if: ? You are sexually active and are younger than 60 years of age. ? You are older than 60 years of age and your health care provider tells you that you are at risk for this type of infection. ? Your sexual activity has changed since you were last screened and you are at an increased risk for chlamydia or gonorrhea. Ask your health care provider if you are at risk.  Talk with your health care provider about whether you are at high risk of being infected with HIV. Your health care provider may recommend a prescription medicine to help prevent HIV infection.  What else can I do?  Schedule regular health, dental, and eye exams.  Stay current with your vaccines (immunizations).  Do not use any tobacco products, such as cigarettes, chewing tobacco, and e-cigarettes. If you need help quitting, ask your health care provider.  Limit alcohol intake to no more than 2 drinks per day. One drink equals 12 ounces of beer, 5 ounces of wine, or 1 ounces of hard liquor.  Do not use street drugs.  Do not share needles.  Ask your health care provider for help if you need support or information about quitting drugs.  Tell your health care provider if you often feel depressed.  Tell your health care provider if you have ever been abused or do not feel safe at home. This information is not intended to replace advice given to you by  your health care provider. Make sure you discuss any questions you have with your health care provider. Document Released: 05/16/2008 Document Revised: 07/17/2016 Document Reviewed: 08/22/2015 Elsevier Interactive Patient Education  2018 Elsevier Inc.      Agustina Caroli, MD [facility]

## 2017-07-07 NOTE — Patient Instructions (Addendum)
   IF you received an x-ray today, you will receive an invoice from [facility]. Please contact [facility] at [phone] with questions or concerns regarding your invoice.   IF you received labwork today, you will receive an invoice from [facility]. Please contact [facility] at [phone] with questions or concerns regarding your invoice.   Our billing staff will not be able to assist you with questions regarding bills from these companies.  You will be contacted with the lab results as soon as they are available. The fastest way to get your results is to activate your My Chart account. Instructions are located on the last page of this paperwork. If you have not heard from us regarding the results in 2 weeks, please contact this office.      Health Maintenance, Male A healthy lifestyle and preventive care is important for your health and wellness. Ask your health care provider about what schedule of regular examinations is right for you. What should I know about weight and diet? Eat a Healthy Diet  Eat plenty of vegetables, fruits, whole grains, low-fat dairy products, and lean protein.  Do not eat a lot of foods high in solid fats, added sugars, or salt.  Maintain a Healthy Weight Regular exercise can help you achieve or maintain a healthy weight. You should:  Do at least 150 minutes of exercise each week. The exercise should increase your heart rate and make you sweat (moderate-intensity exercise).  Do strength-training exercises at least twice a week.  Watch Your Levels of Cholesterol and Blood Lipids  Have your blood tested for lipids and cholesterol every 5 years starting at 60 years of age. If you are at high risk for heart disease, you should start having your blood tested when you are 60 years old. You may need to have your cholesterol levels checked more often if: ? Your lipid or cholesterol levels are high. ? You are older than 60 years of age. ? You  are at high risk for heart disease.  What should I know about cancer screening? Many types of cancers can be detected early and may often be prevented. Lung Cancer  You should be screened every year for lung cancer if: ? You are a current smoker who has smoked for at least 30 years. ? You are a former smoker who has quit within the past 15 years.  Talk to your health care provider about your screening options, when you should start screening, and how often you should be screened.  Colorectal Cancer  Routine colorectal cancer screening usually begins at 60 years of age and should be repeated every 5-10 years until you are 60 years old. You may need to be screened more often if early forms of precancerous polyps or small growths are found. Your health care provider may recommend screening at an earlier age if you have risk factors for colon cancer.  Your health care provider may recommend using home test kits to check for hidden blood in the stool.  A small camera at the end of a tube can be used to examine your colon (sigmoidoscopy or colonoscopy). This checks for the earliest forms of colorectal cancer.  Prostate and Testicular Cancer  Depending on your age and overall health, your health care provider may do certain tests to screen for prostate and testicular cancer.  Talk to your health care provider about any symptoms or concerns you have about testicular or prostate cancer.  Skin Cancer  Check your skin   from head to toe regularly.  Tell your health care provider about any new moles or changes in moles, especially if: ? There is a change in a mole's size, shape, or color. ? You have a mole that is larger than a pencil eraser.  Always use sunscreen. Apply sunscreen liberally and repeat throughout the day.  Protect yourself by wearing long sleeves, pants, a wide-brimmed hat, and sunglasses when outside.  What should I know about heart disease, diabetes, and high blood  pressure?  If you are 18-39 years of age, have your blood pressure checked every 3-5 years. If you are 40 years of age or older, have your blood pressure checked every year. You should have your blood pressure measured twice-once when you are at a hospital or clinic, and once when you are not at a hospital or clinic. Record the average of the two measurements. To check your blood pressure when you are not at a hospital or clinic, you can use: ? An automated blood pressure machine at a pharmacy. ? A home blood pressure monitor.  Talk to your health care provider about your target blood pressure.  If you are between 45-79 years old, ask your health care provider if you should take aspirin to prevent heart disease.  Have regular diabetes screenings by checking your fasting blood sugar level. ? If you are at a normal weight and have a low risk for diabetes, have this test once every three years after the age of 45. ? If you are overweight and have a high risk for diabetes, consider being tested at a younger age or more often.  A one-time screening for abdominal aortic aneurysm (AAA) by ultrasound is recommended for men aged 65-75 years who are current or former smokers. What should I know about preventing infection? Hepatitis B If you have a higher risk for hepatitis B, you should be screened for this virus. Talk with your health care provider to find out if you are at risk for hepatitis B infection. Hepatitis C Blood testing is recommended for:  Everyone born from 1945 through 1965.  Anyone with known risk factors for hepatitis C.  Sexually Transmitted Diseases (STDs)  You should be screened each year for STDs including gonorrhea and chlamydia if: ? You are sexually active and are younger than 60 years of age. ? You are older than 60 years of age and your health care provider tells you that you are at risk for this type of infection. ? Your sexual activity has changed since you were last  screened and you are at an increased risk for chlamydia or gonorrhea. Ask your health care provider if you are at risk.  Talk with your health care provider about whether you are at high risk of being infected with HIV. Your health care provider may recommend a prescription medicine to help prevent HIV infection.  What else can I do?  Schedule regular health, dental, and eye exams.  Stay current with your vaccines (immunizations).  Do not use any tobacco products, such as cigarettes, chewing tobacco, and e-cigarettes. If you need help quitting, ask your health care provider.  Limit alcohol intake to no more than 2 drinks per day. One drink equals 12 ounces of beer, 5 ounces of wine, or 1 ounces of hard liquor.  Do not use street drugs.  Do not share needles.  Ask your health care provider for help if you need support or information about quitting drugs.  Tell your health care   provider if you often feel depressed.  Tell your health care provider if you have ever been abused or do not feel safe at home. This information is not intended to replace advice given to you by your health care provider. Make sure you discuss any questions you have with your health care provider. Document Released: 05/16/2008 Document Revised: 07/17/2016 Document Reviewed: 08/22/2015 Elsevier Interactive Patient Education  2018 Elsevier Inc.  

## 2017-07-08 LAB — CBC WITH DIFFERENTIAL/PLATELET
BASOS: 0 %
Basophils Absolute: 0 10*3/uL (ref 0.0–0.2)
EOS (ABSOLUTE): 0.1 10*3/uL (ref 0.0–0.4)
Eos: 3 %
HEMATOCRIT: 48 % (ref 37.5–51.0)
HEMOGLOBIN: 16.9 g/dL (ref 13.0–17.7)
Immature Grans (Abs): 0 10*3/uL (ref 0.0–0.1)
Immature Granulocytes: 0 %
LYMPHS ABS: 1.2 10*3/uL (ref 0.7–3.1)
Lymphs: 30 %
MCH: 33.3 pg — ABNORMAL HIGH (ref 26.6–33.0)
MCHC: 35.2 g/dL (ref 31.5–35.7)
MCV: 95 fL (ref 79–97)
MONOS ABS: 0.3 10*3/uL (ref 0.1–0.9)
Monocytes: 7 %
Neutrophils Absolute: 2.4 10*3/uL (ref 1.4–7.0)
Neutrophils: 60 %
Platelets: 221 10*3/uL (ref 150–379)
RBC: 5.07 x10E6/uL (ref 4.14–5.80)
RDW: 14.2 % (ref 12.3–15.4)
WBC: 4 10*3/uL (ref 3.4–10.8)

## 2017-07-08 LAB — COMPREHENSIVE METABOLIC PANEL
A/G RATIO: 1.9 (ref 1.2–2.2)
ALBUMIN: 4.5 g/dL (ref 3.5–5.5)
ALT: 21 IU/L (ref 0–44)
AST: 18 IU/L (ref 0–40)
Alkaline Phosphatase: 56 IU/L (ref 39–117)
BUN / CREAT RATIO: 16 (ref 9–20)
BUN: 11 mg/dL (ref 6–24)
Bilirubin Total: 0.5 mg/dL (ref 0.0–1.2)
CO2: 23 mmol/L (ref 20–29)
Calcium: 9.5 mg/dL (ref 8.7–10.2)
Chloride: 104 mmol/L (ref 96–106)
Creatinine, Ser: 0.67 mg/dL — ABNORMAL LOW (ref 0.76–1.27)
GFR calc Af Amer: 122 mL/min/{1.73_m2} (ref >59)
GFR, EST NON AFRICAN AMERICAN: 105 mL/min/{1.73_m2} (ref >59)
Globulin, Total: 2.4 g/dL (ref 1.5–4.5)
Glucose: 93 mg/dL (ref 65–99)
POTASSIUM: 4.4 mmol/L (ref 3.5–5.2)
Sodium: 143 mmol/L (ref 134–144)
Total Protein: 6.9 g/dL (ref 6.0–8.5)

## 2017-07-08 LAB — LIPID PANEL
CHOL/HDL RATIO: 3 ratio (ref 0.0–5.0)
Cholesterol, Total: 142 mg/dL (ref 100–199)
HDL: 48 mg/dL (ref >39)
LDL CALC: 79 mg/dL (ref 0–99)
Triglycerides: 74 mg/dL (ref 0–149)
VLDL Cholesterol Cal: 15 mg/dL (ref 5–40)

## 2017-07-08 LAB — HEMOGLOBIN A1C
ESTIMATED AVERAGE GLUCOSE: 100 mg/dL
Hgb A1c MFr Bld: 5.1 % (ref 4.8–5.6)

## 2017-07-08 LAB — PSA: PROSTATE SPECIFIC AG, SERUM: 4.4 ng/mL — AB (ref 0.0–4.0)

## 2017-07-08 LAB — TSH: TSH: 1.03 u[IU]/mL (ref 0.450–4.500)

## 2017-07-08 LAB — HIV ANTIBODY (ROUTINE TESTING W REFLEX): HIV Screen 4th Generation wRfx: NONREACTIVE

## 2017-07-08 LAB — HEPATITIS C ANTIBODY

## 2017-07-09 ENCOUNTER — Other Ambulatory Visit: Payer: Self-pay | Admitting: Emergency Medicine

## 2017-07-09 ENCOUNTER — Ambulatory Visit: Payer: Self-pay | Admitting: Family Medicine

## 2017-07-09 ENCOUNTER — Encounter: Payer: Self-pay | Admitting: Radiology

## 2017-07-09 DIAGNOSIS — R972 Elevated prostate specific antigen [PSA]: Secondary | ICD-10-CM

## 2017-07-11 ENCOUNTER — Encounter: Payer: Self-pay | Admitting: Gastroenterology

## 2017-07-14 ENCOUNTER — Ambulatory Visit: Payer: Self-pay | Admitting: Family Medicine

## 2017-09-08 ENCOUNTER — Ambulatory Visit (AMBULATORY_SURGERY_CENTER): Payer: Self-pay

## 2017-09-08 VITALS — Ht 67.0 in | Wt 160.0 lb

## 2017-09-08 DIAGNOSIS — Z1211 Encounter for screening for malignant neoplasm of colon: Secondary | ICD-10-CM

## 2017-09-08 MED ORDER — NA SULFATE-K SULFATE-MG SULF 17.5-3.13-1.6 GM/177ML PO SOLN: 1.0000 | Freq: Once | ORAL | 0 refills | Status: AC

## 2017-09-08 NOTE — Progress Notes (Signed)
Denies allergies to eggs or soy products. Denies complication of anesthesia or sedation. Denies use of weight loss medication. Denies use of O2.   Emmi instructions given for colonoscopy.  

## 2017-09-09 ENCOUNTER — Encounter: Payer: Self-pay | Admitting: Gastroenterology

## 2017-09-22 ENCOUNTER — Ambulatory Visit (AMBULATORY_SURGERY_CENTER): Payer: BLUE CROSS/BLUE SHIELD | Admitting: Gastroenterology

## 2017-09-22 ENCOUNTER — Encounter: Payer: Self-pay | Admitting: Gastroenterology

## 2017-09-22 VITALS — BP 115/69 | HR 68 | Temp 98.6°F | Resp 13 | Ht 67.0 in | Wt 160.0 lb

## 2017-09-22 DIAGNOSIS — Z1211 Encounter for screening for malignant neoplasm of colon: Secondary | ICD-10-CM | POA: Diagnosis present

## 2017-09-22 DIAGNOSIS — Z1212 Encounter for screening for malignant neoplasm of rectum: Secondary | ICD-10-CM | POA: Diagnosis not present

## 2017-09-22 DIAGNOSIS — D120 Benign neoplasm of cecum: Secondary | ICD-10-CM | POA: Diagnosis not present

## 2017-09-22 MED ORDER — SODIUM CHLORIDE 0.9 % IV SOLN: 500.0000 mL | INTRAVENOUS | Status: DC

## 2017-09-22 NOTE — Progress Notes (Signed)
Called to room to assist during endoscopic procedure.  Patient ID and intended procedure confirmed with present staff. Received instructions for my participation in the procedure from the performing physician.  

## 2017-09-22 NOTE — Progress Notes (Signed)
Pt's states no medical or surgical changes since previsit or office visit. 

## 2017-09-22 NOTE — Progress Notes (Signed)
To PACU, VSS. Report to [email or web address] 

## 2017-09-22 NOTE — Patient Instructions (Signed)
**   Handouts given on polyps and diverticulosis **   YOU HAD AN ENDOSCOPIC PROCEDURE TODAY AT THE [facility]:   Refer to the procedure report that was given to you for any specific questions about what was found during the examination.  If the procedure report does not answer your questions, please call your gastroenterologist to clarify.  If you requested that your care partner not be given the details of your procedure findings, then the procedure report has been included in a sealed envelope for you to review at your convenience later.  YOU SHOULD EXPECT: Some feelings of bloating in the abdomen. Passage of more gas than usual.  Walking can help get rid of the air that was put into your GI tract during the procedure and reduce the bloating. If you had a lower endoscopy (such as a colonoscopy or flexible sigmoidoscopy) you may notice spotting of blood in your stool or on the toilet paper. If you underwent a bowel prep for your procedure, you may not have a normal bowel movement for a few days.  Please Note:  You might notice some irritation and congestion in your nose or some drainage.  This is from the oxygen used during your procedure.  There is no need for concern and it should clear up in a day or so.  SYMPTOMS TO REPORT IMMEDIATELY:   Following lower endoscopy (colonoscopy or flexible sigmoidoscopy):  Excessive amounts of blood in the stool  Significant tenderness or worsening of abdominal pains  Swelling of the abdomen that is new, acute  Fever of 100F or higher  For urgent or emergent issues, a gastroenterologist can be reached at any hour by calling [phone].   DIET:  We do recommend a small meal at first, but then you may proceed to your regular diet.  Drink plenty of fluids but you should avoid alcoholic beverages for 24 hours.  ACTIVITY:  You should plan to take it easy for the rest of today and you should NOT DRIVE or use heavy machinery until tomorrow (because  of the sedation medicines used during the test).    FOLLOW UP: Our staff will call the number listed on your records the next business day following your procedure to check on you and address any questions or concerns that you may have regarding the information given to you following your procedure. If we do not reach you, we will leave a message.  However, if you are feeling well and you are not experiencing any problems, there is no need to return our call.  We will assume that you have returned to your regular daily activities without incident.  If any biopsies were taken you will be contacted by phone or by letter within the next 1-3 weeks.  Please call us at [phone] if you have not heard about the biopsies in 3 weeks.    SIGNATURES/CONFIDENTIALITY: You and/or your care partner have signed paperwork which will be entered into your electronic medical record.  These signatures attest to the fact that that the information above on your After Visit Summary has been reviewed and is understood.  Full responsibility of the confidentiality of this discharge information lies with you and/or your care-partner. 

## 2017-09-22 NOTE — Op Note (Signed)
[place] Patient Name: Chad Kelley Procedure Date: 09/22/2017 11:27 AM MRN: [id number] Endoscopist: Ladene Artist , MD Age: 61 Referring MD:  Date of Birth: August 17, 1956 Gender: Male Account #: [id number] Procedure:                Colonoscopy Indications:              Screening for colorectal malignant neoplasm Medicines:                Monitored Anesthesia Care Procedure:                Pre-Anesthesia Assessment:                           - Prior to the procedure, a History and Physical                            was performed, and patient medications and                            allergies were reviewed. The patient's tolerance of                            previous anesthesia was also reviewed. The risks                            and benefits of the procedure and the sedation                            options and risks were discussed with the patient.                            All questions were answered, and informed consent                            was obtained. Prior Anticoagulants: The patient has                            taken no previous anticoagulant or antiplatelet                            agents. ASA Grade Assessment: II - A patient with                            mild systemic disease. After reviewing the risks                            and benefits, the patient was deemed in                            satisfactory condition to undergo the procedure.                           After obtaining informed consent, the colonoscope  was passed under direct vision. Throughout the                            procedure, the patient's blood pressure, pulse, and                            oxygen saturations were monitored continuously. The                            Colonoscope was introduced through the anus and                            advanced to the the cecum, identified by                            appendiceal orifice and  ileocecal valve. The                            ileocecal valve, appendiceal orifice, and rectum                            were photographed. The quality of the bowel                            preparation was excellent. The colonoscopy was                            performed without difficulty. The patient tolerated                            the procedure well. Scope In: 11:36:53 AM Scope Out: 11:53:06 AM Scope Withdrawal Time: 0 hours 12 minutes 8 seconds  Total Procedure Duration: 0 hours 16 minutes 13 seconds  Findings:                 The perianal and digital rectal examinations were                            normal.                           A 6 mm polyp was found in the cecum. The polyp was                            sessile. The polyp was removed with a cold snare.                            Resection and retrieval were complete.                           A few medium-mouthed diverticula were found in the                            transverse colon. There was no evidence of  diverticular bleeding.                           The exam was otherwise without abnormality on                            direct and retroflexion views. Complications:            No immediate complications. Estimated blood loss:                            None. Estimated Blood Loss:     Estimated blood loss: none. Impression:               - One 6 mm polyp in the cecum, removed with a cold                            snare. Resected and retrieved.                           - Mild diverticulosis in the transverse colon.                            There was no evidence of diverticular bleeding.                           - The examination was otherwise normal on direct                            and retroflexion views. Recommendation:           - Repeat colonoscopy in 5 years for surveillance if                            polyp is precancerous, otherwise 10 years for                             screening.                           - Patient has a contact number available for                            emergencies. The signs and symptoms of potential                            delayed complications were discussed with the                            patient. Return to normal activities tomorrow.                            Written discharge instructions were provided to the                            patient.                           -  Resume previous diet.                           - Continue present medications.                           - Await pathology results. Ladene Artist, MD 09/22/2017 11:57:19 AM This report has been signed electronically.

## 2017-09-23 ENCOUNTER — Telehealth: Payer: Self-pay | Admitting: *Deleted

## 2017-09-23 NOTE — Telephone Encounter (Signed)
  Follow up Call-  Call back number 09/22/2017  Post procedure Call Back phone  # [phone]  Permission to leave phone message Yes  Some recent data might be hidden     Patient questions:  Do you have a fever, pain , or abdominal swelling? No. Pain Score  0 *  Have you tolerated food without any problems? Yes.    Have you been able to return to your normal activities? Yes.    Do you have any questions about your discharge instructions: Diet   No. Medications  No. Follow up visit  No.  Do you have questions or concerns about your Care? No.  Actions: * If pain score is 4 or above: No action needed, pain <4.

## 2017-10-10 ENCOUNTER — Encounter: Payer: Self-pay | Admitting: Gastroenterology

## 2018-03-02 DIAGNOSIS — G243 Spasmodic torticollis: Secondary | ICD-10-CM | POA: Diagnosis not present

## 2018-10-26 ENCOUNTER — Ambulatory Visit (INDEPENDENT_AMBULATORY_CARE_PROVIDER_SITE_OTHER): Payer: BLUE CROSS/BLUE SHIELD | Admitting: Emergency Medicine

## 2018-10-26 ENCOUNTER — Encounter: Payer: Self-pay | Admitting: Emergency Medicine

## 2018-10-26 ENCOUNTER — Other Ambulatory Visit: Payer: Self-pay

## 2018-10-26 VITALS — BP 136/68 | HR 68 | Temp 98.0°F | Resp 16 | Ht 68.0 in | Wt 161.8 lb

## 2018-10-26 DIAGNOSIS — Z0001 Encounter for general adult medical examination with abnormal findings: Secondary | ICD-10-CM

## 2018-10-26 DIAGNOSIS — Z13228 Encounter for screening for other metabolic disorders: Secondary | ICD-10-CM | POA: Diagnosis not present

## 2018-10-26 DIAGNOSIS — Z130 Encounter for screening for diseases of the blood and blood-forming organs and certain disorders involving the immune mechanism: Secondary | ICD-10-CM

## 2018-10-26 DIAGNOSIS — Z1329 Encounter for screening for other suspected endocrine disorder: Secondary | ICD-10-CM

## 2018-10-26 DIAGNOSIS — Z13220 Encounter for screening for lipoid disorders: Secondary | ICD-10-CM | POA: Diagnosis not present

## 2018-10-26 DIAGNOSIS — Z87898 Personal history of other specified conditions: Secondary | ICD-10-CM | POA: Diagnosis not present

## 2018-10-26 DIAGNOSIS — G600 Hereditary motor and sensory neuropathy: Secondary | ICD-10-CM

## 2018-10-26 DIAGNOSIS — L299 Pruritus, unspecified: Secondary | ICD-10-CM

## 2018-10-26 MED ORDER — HYDROCORTISONE-ACETIC ACID 1-2 % OT SOLN: 3.0000 [drp] | Freq: Three times a day (TID) | OTIC | 1 refills | Status: DC

## 2018-10-26 MED ORDER — ESCITALOPRAM OXALATE 10 MG PO TABS: 10.0000 mg | ORAL_TABLET | Freq: Every day | ORAL | 5 refills | Status: DC

## 2018-10-26 NOTE — Progress Notes (Signed)
Chad Kelley 62 y.o.   Chief Complaint  Patient presents with  . Annual Exam    HISTORY OF PRESENT ILLNESS: This is a 62 y.o. male here for his annual exam. Has a history of Charcot- Marie- Tooth disease. Has chronic torticollis, getting Botox injections, requesting referral to Dr. Krista Kelley with [facility]. Last visit with me in 2018 revealed PSA of 4.4.  Referred to urology, patient states he was never called. Was taken Lexapro up until 3 months ago.  Requesting refill of medication. Complaining of chronic bilateral ear itching.  HPI   Prior to Admission medications   Medication Sig Start Date End Date Taking? Authorizing Provider  clobetasol ointment (TEMOVATE) 7.62 % Apply 1 application topically 2 (two) times daily.   Yes [provider]  fluticasone (FLONASE) 50 MCG/ACT nasal spray 1 spray each nostril twice a day 01/12/15  Yes Leandrew Koyanagi, MD  escitalopram (LEXAPRO) 10 MG tablet Take 1 tablet (10 mg total) by mouth daily. Patient not taking: Reported on 10/26/2018 07/07/17   Horald Pollen, MD    No Known Allergies  Patient Active Problem List   Diagnosis Date Noted  . Recurrent sinusitis 12/27/2014  . Other seasonal allergic rhinitis 12/27/2014    Past Medical History:  Diagnosis Date  . Allergy   . Charcot-Marie-Tooth disease   . Neuromuscular disorder ([place])   . Pancreatitis     Past Surgical History:  Procedure Laterality Date  . HERNIA MESH REMOVAL    . HERNIA REPAIR    . pancreatitis    . VASECTOMY      Social History   Socioeconomic History  . Marital status: Single    Spouse name: Not on file  . Number of children: Not on file  . Years of education: Not on file  . Highest education level: Not on file  Occupational History  . Not on file  Social Needs  . Financial resource strain: Not on file  . Food insecurity:    Worry: Not on file    Inability: Not on file  . Transportation needs:    Medical: Not on file    Non-medical: Not  on file  Tobacco Use  . Smoking status: Former Research scientist (life sciences)  . Smokeless tobacco: Never Used  . Tobacco comment: ocasionally smoke a cigar  Substance and Sexual Activity  . Alcohol use: No  . Drug use: No  . Sexual activity: Yes  Lifestyle  . Physical activity:    Days per week: Not on file    Minutes per session: Not on file  . Stress: Not on file  Relationships  . Social connections:    Talks on phone: Not on file    Gets together: Not on file    Attends religious service: Not on file    Active member of club or organization: Not on file    Attends meetings of clubs or organizations: Not on file    Relationship status: Not on file  . Intimate partner violence:    Fear of current or ex partner: Not on file    Emotionally abused: Not on file    Physically abused: Not on file    Forced sexual activity: Not on file  Other Topics Concern  . Not on file  Social History Narrative  . Not on file    Family History  Problem Relation Age of Onset  . Diabetes Mother   . Hypertension Father   . Cancer Father   . Colon  cancer Neg Hx   . Esophageal cancer Neg Hx   . Pancreatic cancer Neg Hx   . Rectal cancer Neg Hx   . Stomach cancer Neg Hx      Review of Systems  Constitutional: Negative.  Negative for fever.  HENT: Negative.   Eyes: Negative.  Negative for blurred vision and double vision.  Respiratory: Negative.  Negative for cough and shortness of breath.   Cardiovascular: Negative.  Negative for chest pain and palpitations.  Gastrointestinal: Negative.  Negative for nausea and vomiting.  Genitourinary: Negative.  Negative for dysuria.  Skin: Negative.  Negative for rash.  Neurological: Negative.  Negative for dizziness and headaches.  Endo/Heme/Allergies: Negative.   All other systems reviewed and are negative.  Vitals:   10/26/18 0825  BP: 136/68  Pulse: 68  Resp: 16  Temp: 98 F (36.7 C)  SpO2: 97%     Physical Exam  Constitutional: He is oriented to person,  place, and time. He appears well-developed and well-nourished.  HENT:  Head: Normocephalic and atraumatic.  Right Ear: External ear normal.  Left Ear: External ear normal.  Nose: Nose normal.  Mouth/Throat: Oropharynx is clear and moist.  Eyes: Pupils are equal, round, and reactive to light. Conjunctivae and EOM are normal.  Neck: Decreased range of motion: Some torticollis on the left side.  Cardiovascular: Normal rate and regular rhythm.  Pulmonary/Chest: Effort normal and breath sounds normal.  Abdominal: Soft. There is no tenderness.  Musculoskeletal: Normal range of motion.  Muscle atrophy to his hands with some spasticity  Neurological: He is alert and oriented to person, place, and time. No sensory deficit. He exhibits normal muscle tone.  Skin: Skin is warm and dry. Capillary refill takes less than 2 seconds.  Psychiatric: He has a normal mood and affect. His behavior is normal.  Vitals reviewed.    ASSESSMENT & PLAN: Chad Kelley was seen today for annual exam.  Diagnoses and all orders for this visit:  Encounter for general adult medical examination with abnormal findings  History of elevated PSA -     PSA(Must document that pt has been informed of limitations of PSA testing.) -     Ambulatory referral to Urology  Screening for lipoid disorders -     Lipid panel  Screening for endocrine, metabolic and immunity disorder -     CBC with Differential -     Comprehensive metabolic panel -     Hemoglobin A1c -     Lipid panel  Ear itching -     acetic acid-hydrocortisone (VOSOL-HC) OTIC solution; Place 3 drops into the left ear 3 (three) times daily.  Charcot-Marie-Tooth disease -     Ambulatory referral to Neurology  Other orders -     escitalopram (LEXAPRO) 10 MG tablet; Take 1 tablet (10 mg total) by mouth daily.    Patient Instructions       If you have lab work done today you will be contacted with your lab results within the next 2 weeks.  If you have not  heard from [place] then please contact us. The fastest way to get your results is to register for My Chart.   IF you received an x-ray today, you will receive an invoice from [facility]. Please contact [facility] at [phone] with questions or concerns regarding your invoice.   IF you received labwork today, you will receive an invoice from [facility]. Please contact [facility] at [phone] with questions or concerns regarding your invoice.  Our billing staff will not be able to assist you with questions regarding bills from these companies.  You will be contacted with the lab results as soon as they are available. The fastest way to get your results is to activate your My Chart account. Instructions are located on the last page of this paperwork. If you have not heard from [place] regarding the results in 2 weeks, please contact this office.      Health Maintenance, Male A healthy lifestyle and preventive care is important for your health and wellness. Ask your health care provider about what schedule of regular examinations is right for you. What should I know about weight and diet? Eat a Healthy Diet  Eat plenty of vegetables, fruits, whole grains, low-fat dairy products, and lean protein.  Do not eat a lot of foods high in solid fats, added sugars, or salt.  Maintain a Healthy Weight Regular exercise can help you achieve or maintain a healthy weight. You should:  Do at least 150 minutes of exercise each week. The exercise should increase your heart rate and make you sweat (moderate-intensity exercise).  Do strength-training exercises at least twice a week.  Watch Your Levels of Cholesterol and Blood Lipids  Have your blood tested for lipids and cholesterol every 5 years starting at 61 years of age. If you are at high risk for heart disease, you should start having your blood tested when you are 61 years old. You may need to have your cholesterol levels checked more  often if: ? Your lipid or cholesterol levels are high. ? You are older than 61 years of age. ? You are at high risk for heart disease.  What should I know about cancer screening? Many types of cancers can be detected early and may often be prevented. Lung Cancer  You should be screened every year for lung cancer if: ? You are a current smoker who has smoked for at least 30 years. ? You are a former smoker who has quit within the past 15 years.  Talk to your health care provider about your screening options, when you should start screening, and how often you should be screened.  Colorectal Cancer  Routine colorectal cancer screening usually begins at 61 years of age and should be repeated every 5-10 years until you are 61 years old. You may need to be screened more often if early forms of precancerous polyps or small growths are found. Your health care provider may recommend screening at an earlier age if you have risk factors for colon cancer.  Your health care provider may recommend using home test kits to check for hidden blood in the stool.  A small camera at the end of a tube can be used to examine your colon (sigmoidoscopy or colonoscopy). This checks for the earliest forms of colorectal cancer.  Prostate and Testicular Cancer  Depending on your age and overall health, your health care provider may do certain tests to screen for prostate and testicular cancer.  Talk to your health care provider about any symptoms or concerns you have about testicular or prostate cancer.  Skin Cancer  Check your skin from head to toe regularly.  Tell your health care provider about any new moles or changes in moles, especially if: ? There is a change in a mole's size, shape, or color. ? You have a mole that is larger than a pencil eraser.  Always use sunscreen. Apply sunscreen liberally and repeat throughout the day.  Protect yourself by wearing long sleeves, pants, a wide-brimmed hat, and  sunglasses when outside.  What should I know about heart disease, diabetes, and high blood pressure?  If you are 66-16 years of age, have your blood pressure checked every 3-5 years. If you are 47 years of age or older, have your blood pressure checked every year. You should have your blood pressure measured twice-once when you are at a hospital or clinic, and once when you are not at a hospital or clinic. Record the average of the two measurements. To check your blood pressure when you are not at a hospital or clinic, you can use: ? An automated blood pressure machine at a pharmacy. ? A home blood pressure monitor.  Talk to your health care provider about your target blood pressure.  If you are between 26-73 years old, ask your health care provider if you should take aspirin to prevent heart disease.  Have regular diabetes screenings by checking your fasting blood sugar level. ? If you are at a normal weight and have a low risk for diabetes, have this test once every three years after the age of 28. ? If you are overweight and have a high risk for diabetes, consider being tested at a younger age or more often.  A one-time screening for abdominal aortic aneurysm (AAA) by ultrasound is recommended for men aged 25-75 years who are current or former smokers. What should I know about preventing infection? Hepatitis B If you have a higher risk for hepatitis B, you should be screened for this virus. Talk with your health care provider to find out if you are at risk for hepatitis B infection. Hepatitis C Blood testing is recommended for:  Everyone born from [birth year] through 1964.  Anyone with known risk factors for hepatitis C.  Sexually Transmitted Diseases (STDs)  You should be screened each year for STDs including gonorrhea and chlamydia if: ? You are sexually active and are younger than 61 years of age. ? You are older than 61 years of age and your health care provider tells you that you are  at risk for this type of infection. ? Your sexual activity has changed since you were last screened and you are at an increased risk for chlamydia or gonorrhea. Ask your health care provider if you are at risk.  Talk with your health care provider about whether you are at high risk of being infected with HIV. Your health care provider may recommend a prescription medicine to help prevent HIV infection.  What else can I do?  Schedule regular health, dental, and eye exams.  Stay current with your vaccines (immunizations).  Do not use any tobacco products, such as cigarettes, chewing tobacco, and e-cigarettes. If you need help quitting, ask your health care provider.  Limit alcohol intake to no more than 2 drinks per day. One drink equals 12 ounces of beer, 5 ounces of wine, or 1 ounces of hard liquor.  Do not use street drugs.  Do not share needles.  Ask your health care provider for help if you need support or information about quitting drugs.  Tell your health care provider if you often feel depressed.  Tell your health care provider if you have ever been abused or do not feel safe at home. This information is not intended to replace advice given to you by your health care provider. Make sure you discuss any questions you have with your health care provider. Document Released: 05/16/2008 Document  Revised: 07/17/2016 Document Reviewed: 08/22/2015 Elsevier Interactive Patient Education  2018 [employer].      Agustina Caroli, MD [facility]

## 2018-10-26 NOTE — Patient Instructions (Addendum)

## 2018-10-27 ENCOUNTER — Encounter: Payer: Self-pay | Admitting: Radiology

## 2018-10-27 LAB — CBC WITH DIFFERENTIAL/PLATELET
BASOS ABS: 0.1 10*3/uL (ref 0.0–0.2)
Basos: 1 %
EOS (ABSOLUTE): 0.1 10*3/uL (ref 0.0–0.4)
Eos: 2 %
Hematocrit: 49.3 % (ref 37.5–51.0)
Hemoglobin: 16.9 g/dL (ref 13.0–17.7)
Immature Grans (Abs): 0 10*3/uL (ref 0.0–0.1)
Immature Granulocytes: 0 %
LYMPHS ABS: 1.2 10*3/uL (ref 0.7–3.1)
Lymphs: 28 %
MCH: 31.5 pg (ref 26.6–33.0)
MCHC: 34.3 g/dL (ref 31.5–35.7)
MCV: 92 fL (ref 79–97)
MONOS ABS: 0.4 10*3/uL (ref 0.1–0.9)
Monocytes: 9 %
NEUTROS PCT: 60 %
Neutrophils Absolute: 2.5 10*3/uL (ref 1.4–7.0)
PLATELETS: 234 10*3/uL (ref 150–450)
RBC: 5.37 x10E6/uL (ref 4.14–5.80)
RDW: 13 % (ref 12.3–15.4)
WBC: 4.2 10*3/uL (ref 3.4–10.8)

## 2018-10-27 LAB — HEMOGLOBIN A1C
ESTIMATED AVERAGE GLUCOSE: 105 mg/dL
HEMOGLOBIN A1C: 5.3 % (ref 4.8–5.6)

## 2018-10-27 LAB — LIPID PANEL
CHOLESTEROL TOTAL: 185 mg/dL (ref 100–199)
Chol/HDL Ratio: 3.9 ratio (ref 0.0–5.0)
HDL: 48 mg/dL (ref >39)
LDL CALC: 121 mg/dL — AB (ref 0–99)
Triglycerides: 79 mg/dL (ref 0–149)
VLDL CHOLESTEROL CAL: 16 mg/dL (ref 5–40)

## 2018-10-27 LAB — COMPREHENSIVE METABOLIC PANEL
ALK PHOS: 56 IU/L (ref 39–117)
ALT: 24 IU/L (ref 0–44)
AST: 18 IU/L (ref 0–40)
Albumin/Globulin Ratio: 1.6 (ref 1.2–2.2)
Albumin: 4.5 g/dL (ref 3.6–4.8)
BUN/Creatinine Ratio: 19 (ref 10–24)
BUN: 17 mg/dL (ref 8–27)
Bilirubin Total: 0.4 mg/dL (ref 0.0–1.2)
CO2: 24 mmol/L (ref 20–29)
Calcium: 9.7 mg/dL (ref 8.6–10.2)
Chloride: 102 mmol/L (ref 96–106)
Creatinine, Ser: 0.91 mg/dL (ref 0.76–1.27)
GFR calc Af Amer: 105 mL/min/{1.73_m2} (ref >59)
GFR calc non Af Amer: 91 mL/min/{1.73_m2} (ref >59)
GLOBULIN, TOTAL: 2.8 g/dL (ref 1.5–4.5)
GLUCOSE: 104 mg/dL — AB (ref 65–99)
Potassium: 4.6 mmol/L (ref 3.5–5.2)
SODIUM: 141 mmol/L (ref 134–144)
Total Protein: 7.3 g/dL (ref 6.0–8.5)

## 2018-10-27 LAB — PSA: PROSTATE SPECIFIC AG, SERUM: 2.8 ng/mL (ref 0.0–4.0)

## 2018-10-28 ENCOUNTER — Encounter: Payer: Self-pay | Admitting: Emergency Medicine

## 2018-10-30 ENCOUNTER — Other Ambulatory Visit: Payer: Self-pay | Admitting: Emergency Medicine

## 2018-10-30 MED ORDER — CLOBETASOL PROPIONATE 0.05 % EX OINT: 1.0000 "application " | TOPICAL_OINTMENT | Freq: Two times a day (BID) | CUTANEOUS | 3 refills | Status: AC

## 2018-10-30 MED ORDER — FLUTICASONE PROPIONATE 50 MCG/ACT NA SUSP: NASAL | 3 refills | Status: DC

## 2018-10-30 NOTE — Telephone Encounter (Signed)
I must have missed it. Refilled. Thanks.

## 2018-12-21 ENCOUNTER — Telehealth: Payer: Self-pay | Admitting: Emergency Medicine

## 2018-12-21 NOTE — Telephone Encounter (Signed)
Copied from [place] [phone]. Topic: Quick Communication - Rx Refill/Question >> Dec 21, 2018  9:05 AM Chad Kelley wrote: Medication: AFO on legs  Pt states that he uses AFO on his legs and he broke one last night so he needs a new script.  Pt states if this can be sent as quickly as possible that would be great as he doesn't have back up. Pt would like a call when this has been done so he knows he can go pick up.  Preferred Pharmacy (with phone number or street name): [facility] - phone number is [phone]  Agent: Please be advised that RX refills may take up to 3 business days. We ask that you follow-up with your pharmacy.

## 2018-12-22 ENCOUNTER — Telehealth: Payer: Self-pay | Admitting: Emergency Medicine

## 2018-12-22 NOTE — Telephone Encounter (Signed)
Send the new script then. OK with me.

## 2018-12-22 NOTE — Telephone Encounter (Signed)
I faxed handwritten script to [facility]

## 2018-12-22 NOTE — Telephone Encounter (Unsigned)
Copied from [place] [phone]. Topic: Quick Communication - Rx Refill/Question >> Dec 21, 2018  9:05 AM Reyne Dumas L wrote: Medication: AFO on legs  Pt states that he uses AFO on his legs and he broke one last night so he needs a new script.  Pt states if this can be sent as quickly as possible that would be great as he doesn't have back up. Pt would like a call when this has been done so he knows he can go pick up.  Preferred Pharmacy (with phone number or street name): [facility] - phone number is [phone]  Agent: Please be advised that RX refills may take up to 3 business days. We ask that you follow-up with your pharmacy. >> Dec 22, 2018 10:30 AM Ahmed Prima L wrote: Patient is calling to check the status and would like this faxed too [phone]. Please advise.

## 2018-12-22 NOTE — Telephone Encounter (Signed)
Already sent to [facility]

## 2018-12-22 NOTE — Telephone Encounter (Signed)
I talked with pt and pt states that he need a new Ankle Foot Orthosis (AFO) for his leg due to him breaking one he needs a new script.   I talked with Tye Maryland with [facility]  [phone] phone and fax [phone] which states that new script can be faxed to their office.

## 2018-12-22 NOTE — Telephone Encounter (Unsigned)
Copied from [place] [phone]. Topic: Quick Communication - Rx Refill/Question >> Dec 21, 2018  9:05 AM Reyne Dumas L wrote: Medication: AFO on legs  Pt states that he uses AFO on his legs and he broke one last night so he needs a new script.  Pt states if this can be sent as quickly as possible that would be great as he doesn't have back up. Pt would like a call when this has been done so he knows he can go pick up.  Preferred Pharmacy (with phone number or street name): [facility] - phone number is [phone]  Agent: Please be advised that RX refills may take up to 3 business days. We ask that you follow-up with your pharmacy. >> Dec 22, 2018 10:30 AM Ahmed Prima L wrote: Patient is calling to check the status and would like this faxed too [phone]. Please advise.

## 2018-12-31 DIAGNOSIS — M21372 Foot drop, left foot: Secondary | ICD-10-CM | POA: Diagnosis not present

## 2019-01-04 ENCOUNTER — Encounter: Payer: Self-pay | Admitting: Neurology

## 2019-01-04 ENCOUNTER — Telehealth: Payer: Self-pay | Admitting: Neurology

## 2019-01-04 ENCOUNTER — Ambulatory Visit: Payer: BLUE CROSS/BLUE SHIELD | Admitting: Neurology

## 2019-01-04 VITALS — BP 126/80 | HR 85 | Ht 68.0 in | Wt 162.0 lb

## 2019-01-04 DIAGNOSIS — G600 Hereditary motor and sensory neuropathy: Secondary | ICD-10-CM

## 2019-01-04 DIAGNOSIS — G243 Spasmodic torticollis: Secondary | ICD-10-CM | POA: Diagnosis not present

## 2019-01-04 NOTE — Telephone Encounter (Signed)
2 wk Botox inj. Per Dr. Krista Blue

## 2019-01-04 NOTE — Telephone Encounter (Signed)
Please refer to previous phone note.

## 2019-01-04 NOTE — Telephone Encounter (Signed)
Chad Kelley, could you advise me on where to schedule him within two weeks?

## 2019-01-04 NOTE — Telephone Encounter (Signed)
Please finish his BOTOX preauthorization paper work and put him on my schedule with in 2 weeks. Thanks.

## 2019-01-04 NOTE — Telephone Encounter (Signed)
We can use the 9am, RN work-in slot on 01/21/2019.  If this works for him and he is approved by that date.

## 2019-01-04 NOTE — Progress Notes (Signed)
PATIENT: Chad Kelley DOB: Sep 04, 1956  Chief Complaint  Patient presents with  . Hx of Charot Marie-Tooth Disease    He is here to discuss continuing his Botox treatments for chronic torticollis.  His last injection was in October 2019.   Marland Kitchen PCP    Horald Pollen, MD     HISTORICAL  Chad Kelley is a 63 year old male, seen in request by his primary care physician Dr. Mitchel Honour, [facility] for evaluation of cervical dystonia, wants to continue Botox injection through our office, initial evaluation was on January 04, 2019.  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, his mother, daughter, maternal cousin suffered the CMT disease  EMG nerve conduction study in February 2015: Right ulnar motor revealed prolonged latency with decreased amplitude and decreased conduction velocity within the demyelinating range, ulnar sensory nerve was absent, right radial sensory demonstrated prolonged latency decreased amplitude and decreased conduction velocity in the demyelinating range, radial motor also demonstrated prolonged latency conduction velocity within the demyelinating range, 26 m/s  In 1990s, he noticed gradual onset forceful head turning towards the right side, initially he noticed that while driving, gradually getting worse, was seen by [facility] Dr. Maudry Mayhew, began to receive EMG guided Botox injection since 2012, every 3 to 4 months, most documented injection was on March 02, 2018, I attempted to review previous MRI cervical spine CD in 2009 without  success, there was no report available.  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   Injection pattern from [facility] Guest on March 02 2018. Used BOTOX A 225 units   SCM Left 50 UNITS   Splenius Capitus 25  Levator Scapulae 25  Trap 40 DISPERSED IN AREAS OF PAIN HE SAID THIS HELPED A LOT LAST TIME.    SCM right NONE  Splenius Capitus 75  Levator Scapulae NONE  Trap 10       REVIEW OF SYSTEMS: Full 14 system review of systems performed and notable only for  As above All other review of systems were negative.  ALLERGIES: No Known Allergies  HOME MEDICATIONS: Current Outpatient Medications  Medication Sig Dispense Refill  . acetic acid-hydrocortisone (VOSOL-HC) OTIC solution Place 3 drops into the left ear 3 (three) times daily. 10 mL 1  . escitalopram (LEXAPRO) 10 MG tablet Take 1 tablet (10 mg total) by mouth daily. 30 tablet 5  . fluticasone (FLONASE) 50 MCG/ACT nasal spray 1 spray each nostril twice a day 48 g 3   No current facility-administered medications for this visit.     PAST MEDICAL HISTORY: Past Medical History:  Diagnosis Date  . Allergy   . Charcot-Marie-Tooth disease   . Neuromuscular disorder ([place])   . Pancreatitis     PAST SURGICAL HISTORY: Past Surgical History:  Procedure Laterality Date  . HERNIA MESH REMOVAL    . HERNIA REPAIR    . pancreatitis    . VASECTOMY      FAMILY HISTORY: Family History  Problem  Relation Age of Onset  . Diabetes Mother   . Hypertension Father   . Cancer Father   . Colon cancer Neg Hx   . Esophageal cancer Neg Hx   . Pancreatic cancer Neg Hx   . Rectal cancer Neg Hx   . Stomach cancer Neg Hx     SOCIAL HISTORY: Social History   Socioeconomic History  . Marital status: Single    Spouse name: Not on file  . Number of children: 1  . Years of education: college    . Highest education level: Not on file  Occupational History  . Occupation: Hair Dresser  Social Needs  . Financial resource strain: Not on file  . Food insecurity:    Worry: Not on file    Inability: Not on file  . Transportation needs:    Medical: Not on file    Non-medical: Not on file  Tobacco Use  . Smoking status: Current Some Day Smoker    Types: Cigars  . Smokeless tobacco: Never Used  . Tobacco comment: ocasionally smoke a cigar  Substance and Sexual Activity  . Alcohol use: No  . Drug use: No  . Sexual activity: Yes  Lifestyle  . Physical activity:    Days per week: Not on file    Minutes per session: Not on file  . Stress: Not on file  Relationships  . Social connections:    Talks on phone: Not on file    Gets together: Not on file    Attends religious service: Not on file    Active member of club or organization: Not on file    Attends meetings of clubs or organizations: Not on file    Relationship status: Not on file  . Intimate partner violence:    Fear of current or ex partner: Not on file    Emotionally abused: Not on file    Physically abused: Not on file    Forced sexual activity: Not on file  Other Topics Concern  . Not on file  Social History Narrative   Lives at home with his wife.   Right-handed.   Caffeine use:  Coffee and diet drinks throughout the day.     PHYSICAL EXAM   Vitals:   01/04/19 0756  BP: 126/80  Pulse: 85  Weight: 162 lb (73.5 kg)  Height: 5\' 8"  (1.727 m)    Not recorded      Body mass index is 24.63 kg/m.  PHYSICAL EXAMNIATION:  Gen: NAD, conversant, well nourised, obese, well groomed                     Cardiovascular: Regular rate rhythm, no peripheral edema, warm, nontender. Eyes: Conjunctivae clear without exudates or hemorrhage Neck: Supple, no carotid bruits. Pulmonary: Clear to auscultation bilaterally   NEUROLOGICAL EXAM: He has significant right turn, moderate left tilt, MENTAL  STATUS: Speech:    Speech is normal; fluent and spontaneous with normal comprehension.  Cognition:     Orientation to time, place and person     Normal recent and remote memory     Normal Attention span and concentration     Normal Language, naming, repeating,spontaneous speech     Fund of knowledge   CRANIAL NERVES: CN II: Visual fields are full to confrontation.  Pupils are round equal and briskly reactive to light. CN III, IV, VI: extraocular movement are normal. No ptosis. CN V: Facial sensation is intact to pinprick in all 3 divisions  bilaterally. Corneal responses are intact.  CN VII: Face is symmetric with normal eye closure and smile. CN VIII: Hearing is normal to rubbing fingers CN IX, X: Palate elevates symmetrically. Phonation is normal. CN XI: Head turning and shoulder shrug are intact CN XII: Tongue is midline with normal movements and no atrophy.  MOTOR: He has no neck flexion weakness, significant atrophy of bilateral distal leg muscles, forearm and hand muscles, motor strength as follows (R/L) Shoulder abduction 5-/5-, elbow flexion 5-/5-, external rotation 5-/5-, wrist flexion5-/5-, wrist extension, finger abduction3/3, grip 4/4 Hip flexion 5/5, knee flexion5/5, knee extension5/5, no movement at ankle dorsiflexion plantarflexion,  REFLEXES: Reflexes are absent at the biceps, triceps, 2/2 at knees, and absent at ankles. Plantar responses are flexor.  SENSORY: Length dependent decreased to vibratory sensation pinprick to knee level, absent toe proprioception, preserved ankle proprioception Decreased pinprick to mid forearm level,  COORDINATION: Rapid alternating movements and fine finger movements are intact. There is no dysmetria on finger-to-nose and heel-knee-shin.    GAIT/STANCE: Bilateral flailed gait, unsteady, much improved with bilateral AFO  DIAGNOSTIC DATA (LABS, IMAGING, TESTING) - I reviewed patient records, labs, notes, testing and imaging myself  where available.   ASSESSMENT AND PLAN  DURIEL DEERY is a 63 y.o. male   Autosomal dominant Charcot-Marie-Tooth disease, genetically confirmed GJB1 mutation, Cervical dystonia  Moderate right turn, left tilt,  Preauthorization for Botox A 300 units  Return to clinic in 2 weeks for injection   Marcial Pacas, M.D. Ph.D.  [facility] [address] [place], [place] [address] Ph: [phone] Fax: [phone]  CC: Horald Pollen, MD

## 2019-01-05 NOTE — Telephone Encounter (Signed)
Noted, thank you

## 2019-01-05 NOTE — Telephone Encounter (Signed)
FYI pt called back to confirm the appointment date and time will work for him, he will check in @ 8:45

## 2019-01-05 NOTE — Telephone Encounter (Signed)
I called the patient and I offered him this apt. Patient stated he would take it but he is going to call back and verify that it will work. Please just confirm the apt is ok when he calls back. I do not need to speak with him. If this apt does not work please send a phone note to me and [facility] manager.

## 2019-01-07 ENCOUNTER — Telehealth: Payer: Self-pay | Admitting: *Deleted

## 2019-01-07 NOTE — Telephone Encounter (Signed)
Spoke to patient yesterday (afternoon) concerning the prescription for ankle foot orthosis bilateral, this Rx was faxed to [facility] on 12/23/2018. I advised [employer] is requesting office visit notes from this practice for the insurance to fill Rx . I checked and I did not see OV notes, but he stated he has the ankle orthosis. Also, I advised him he needs to contact [facility] to sign authorization for release of notes for the visit. He will contact [facility] and [employer] to let them know he has braces.

## 2019-01-13 NOTE — Telephone Encounter (Signed)
Started [id number] pending fax clinicals to [phone].Clinicals have been sent over.

## 2019-01-18 NOTE — Telephone Encounter (Signed)
I called and checked status but it was still pending. Pre cert number to check status [phone].

## 2019-01-19 NOTE — Telephone Encounter (Signed)
I called to check status of the authorization. Authorization is still pending. I asked about a turn around time and she stated that they do not give turn around times to people. She transfer me to a nurse to look at it.  The nurse was [place]. While holding the call was disconnected.

## 2019-01-19 NOTE — Telephone Encounter (Signed)
I spoke with Anderson Malta who stated it was approved through (01/12/20).

## 2019-01-21 ENCOUNTER — Encounter: Payer: Self-pay | Admitting: Neurology

## 2019-01-21 ENCOUNTER — Ambulatory Visit (INDEPENDENT_AMBULATORY_CARE_PROVIDER_SITE_OTHER): Payer: BLUE CROSS/BLUE SHIELD | Admitting: Neurology

## 2019-01-21 VITALS — BP 124/78 | HR 75 | Ht 68.0 in | Wt 164.0 lb

## 2019-01-21 DIAGNOSIS — G243 Spasmodic torticollis: Secondary | ICD-10-CM

## 2019-01-21 MED ORDER — ONABOTULINUMTOXINA 100 UNITS IJ SOLR
300.0000 [IU] | Freq: Once | INTRAMUSCULAR | Status: AC
  Administered 2019-01-21: 300 [IU] via INTRAMUSCULAR

## 2019-01-21 NOTE — Progress Notes (Signed)
**  Botox 100 units x 3 vials, NDC 1594-7076-15, Lot [id number], Exp 07/2021, office supply.//mck,rn**

## 2019-01-21 NOTE — Progress Notes (Signed)
PATIENT: Chad Kelley DOB: [birth year]  Chief Complaint  Patient presents with  . Cervical Dystonia    Botox 100 units - office supply (1st injection at our office)     HISTORICAL  Chad Kelley is a 63 year old male, seen in request by his primary care physician Dr. Mitchel Honour, [facility] for evaluation of cervical dystonia, wants to continue Botox injection through our office, initial evaluation was on January 04, 2019.  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, his mother, daughter, maternal cousin suffered the CMT disease  EMG nerve conduction study in February 2015: Right ulnar motor revealed prolonged latency with decreased amplitude and decreased conduction velocity within the demyelinating range, ulnar sensory nerve was absent, right radial sensory demonstrated prolonged latency decreased amplitude and decreased conduction velocity in the demyelinating range, radial motor also demonstrated prolonged latency conduction velocity within the demyelinating range, 26 m/s  In 1990s, he noticed gradual onset forceful head turning towards the right side, initially he noticed that while driving, gradually getting worse, was seen by [facility] Dr. Maudry Mayhew, began to receive EMG guided Botox injection since 2012, every 3 to 4 months, most documented injection was on March 02, 2018, I attempted to review previous MRI cervical spine CD in 2009 without success, there was no report available.  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   Injection pattern from [facility] Guest on March 02 2018. Used BOTOX A 225 units   SCM Left 50 UNITS   Splenius Capitus 25  Levator Scapulae 25  Trap 40 DISPERSED IN AREAS OF PAIN HE SAID THIS HELPED A LOT LAST TIME.    SCM right NONE  Splenius Capitus 75  Levator Scapulae NONE  Trap 10   UPDATE Jan 21 2019: 1st EMG guided BOTOX A injection, used 300 units      REVIEW OF SYSTEMS: Full 14 system review of systems performed and notable only for  As above All other review of systems were negative.  ALLERGIES: No Known Allergies  HOME MEDICATIONS: Current Outpatient Medications  Medication Sig Dispense Refill  . acetic acid-hydrocortisone (VOSOL-HC) OTIC solution Place 3 drops into the left ear 3 (three) times daily. 10 mL 1  . escitalopram (LEXAPRO) 10 MG tablet Take 1 tablet (10 mg total) by mouth daily. 30 tablet 5  . fluticasone (FLONASE) 50 MCG/ACT nasal spray 1 spray each nostril twice a day 48 g 3  . OnabotulinumtoxinA (BOTOX IM) Inject 300 Units into the muscle every 3 (three) months.     No current facility-administered medications for this visit.     PAST MEDICAL HISTORY: Past Medical History:  Diagnosis Date  . Allergy   . Charcot-Marie-Tooth disease   . Neuromuscular disorder ([place])   . Pancreatitis     PAST SURGICAL HISTORY: Past Surgical History:  Procedure Laterality Date  . HERNIA MESH REMOVAL    . HERNIA REPAIR    . pancreatitis    . VASECTOMY  FAMILY HISTORY: Family History  Problem Relation Age of Onset  . Diabetes Mother   . Hypertension Father   . Cancer Father   . Colon cancer Neg Hx   . Esophageal cancer Neg Hx   . Pancreatic cancer Neg Hx   . Rectal cancer Neg Hx   . Stomach cancer Neg Hx     SOCIAL HISTORY: Social History   Socioeconomic History  . Marital status: Single    Spouse name: Not on file  .  Number of children: 1  . Years of education: college  . Highest education level: Not on file  Occupational History  . Occupation: Hair Dresser  Social Needs  . Financial resource strain: Not on file  . Food insecurity:    Worry: Not on file    Inability: Not on file  . Transportation needs:    Medical: Not on file    Non-medical: Not on file  Tobacco Use  . Smoking status: Current Some Day Smoker    Types: Cigars  . Smokeless tobacco: Never Used  . Tobacco comment: ocasionally smoke a cigar  Substance and Sexual Activity  . Alcohol use: No  . Drug use: No  . Sexual activity: Yes  Lifestyle  . Physical activity:    Days per week: Not on file    Minutes per session: Not on file  . Stress: Not on file  Relationships  . Social connections:    Talks on phone: Not on file    Gets together: Not on file    Attends religious service: Not on file    Active member of club or organization: Not on file    Attends meetings of clubs or organizations: Not on file    Relationship status: Not on file  . Intimate partner violence:    Fear of current or ex partner: Not on file    Emotionally abused: Not on file    Physically abused: Not on file    Forced sexual activity: Not on file  Other Topics Concern  . Not on file  Social History Narrative   Lives at home with his wife.   Right-handed.   Caffeine use:  Coffee and diet drinks throughout the day.     PHYSICAL EXAM   Vitals:   01/21/19 0849  BP: 124/78  Pulse: 75  Weight: 164 lb (74.4 kg)  Height: 5\' 8"  (1.727 m)    Not recorded      Body mass index is 24.94 kg/m.  PHYSICAL EXAMNIATION:  He has significant right turn, moderate left tilt, mild retrocollis  ASSESSMENT AND PLAN  Chad Kelley is a 63 y.o. male   Autosomal dominant Charcot-Marie-Tooth disease, genetically confirmed GJB1 mutation, Cervical dystonia  Moderate right turn, left tilt, mild retrocollis Used BOTOX A 300 units.  Left sternocleidomastoid  25 unitsx3= 75 units  Right longissimus capitis 25 unitsx2=50 units Right splenius capitis 25 unitsx3= 75 units Right semispinalis 25 unitsx2=50 units  Right inferior oblique capitis 25 units Left inferior oblique capitis 25 units  Patient tolerated injection well, will return to clinic in 3 months for repeat injection  Marcial Pacas, M.D. Ph.D.  [facility] [address] [place], [place] [address] Ph: [phone] Fax: [phone]  CC: Horald Pollen, MD

## 2019-04-21 ENCOUNTER — Ambulatory Visit: Payer: Self-pay | Admitting: Neurology

## 2019-05-03 ENCOUNTER — Ambulatory Visit: Payer: Self-pay | Admitting: Neurology

## 2019-06-07 ENCOUNTER — Encounter: Payer: Self-pay | Admitting: Neurology

## 2019-06-07 ENCOUNTER — Other Ambulatory Visit: Payer: Self-pay

## 2019-06-07 ENCOUNTER — Ambulatory Visit: Payer: BC Managed Care – PPO | Admitting: Neurology

## 2019-06-07 VITALS — BP 122/79 | HR 74 | Temp 97.3°F | Ht 68.0 in | Wt 160.5 lb

## 2019-06-07 DIAGNOSIS — G243 Spasmodic torticollis: Secondary | ICD-10-CM | POA: Diagnosis not present

## 2019-06-07 NOTE — Progress Notes (Signed)
PATIENT: Chad Kelley DOB: [birth year]  Chief Complaint  Patient presents with  . Cervical Dystonia    Botox 100 units x 3 - office supply     HISTORICAL  Chad Kelley is a 63 year old male, seen in request by his primary care physician Dr. Mitchel Honour, [facility] for evaluation of cervical dystonia, wants to continue Botox injection through our office, initial evaluation was on January 04, 2019.  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, his mother, daughter, maternal cousin suffered the CMT disease  EMG nerve conduction study in February 2015: Right ulnar motor revealed prolonged latency with decreased amplitude and decreased conduction velocity within the demyelinating range, ulnar sensory nerve was absent, right radial sensory demonstrated prolonged latency decreased amplitude and decreased conduction velocity in the demyelinating range, radial motor also demonstrated prolonged latency conduction velocity within the demyelinating range, 26 m/s  In 1990s, he noticed gradual onset forceful head turning towards the right side, initially he noticed that while driving, gradually getting worse, was seen by [facility] Dr. Maudry Mayhew, began to receive EMG guided Botox injection since 2012, every 3 to 4 months, most documented injection was on March 02, 2018, I attempted to review previous MRI cervical spine CD in 2009 without success, there was no report available.  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   Injection pattern from [facility] Guest on March 02 2018. Used BOTOX A 225 units   SCM Left 50 UNITS   Splenius Capitus 25  Levator Scapulae 25  Trap 40 DISPERSED IN AREAS OF PAIN HE SAID THIS HELPED A LOT LAST TIME.    SCM right NONE  Splenius Capitus 75  Levator Scapulae NONE  Trap 10   UPDATE Jan 21 2019: 1st EMG guided BOTOX A injection, used 300 units    UPDATE July 6th 2020: He responded very well to previous Botox injection on January 21, 2019, we used Botox a 300 units, today he complains of straining at left cervical region   REVIEW OF SYSTEMS: Full 14 system review of systems performed and notable only for  As above All other review of systems were negative.  ALLERGIES: No Known Allergies  HOME MEDICATIONS: Current Outpatient Medications  Medication Sig Dispense Refill  . acetic acid-hydrocortisone (VOSOL-HC) OTIC solution Place 3 drops into the left ear 3 (three) times daily. 10 mL 1  . escitalopram (LEXAPRO) 10 MG tablet Take 1 tablet (10 mg total) by mouth daily. 30 tablet 5  . fluticasone (FLONASE) 50 MCG/ACT nasal spray 1 spray each nostril twice a day 48 g 3  . OnabotulinumtoxinA (BOTOX IM) Inject 300 Units into the muscle every 3 (three) months.     No current facility-administered medications for this visit.     PAST MEDICAL HISTORY: Past Medical History:  Diagnosis Date  . Allergy   . Charcot-Marie-Tooth disease   . Neuromuscular disorder ([place])   . Pancreatitis     PAST SURGICAL HISTORY: Past Surgical History:  Procedure Laterality Date  . HERNIA MESH REMOVAL    . HERNIA REPAIR    . pancreatitis    . VASECTOMY      FAMILY HISTORY: Family History  Problem Relation Age of Onset  . Diabetes Mother   . Hypertension Father   . Cancer Father   . Colon cancer Neg Hx   . Esophageal cancer Neg Hx   . Pancreatic cancer Neg Hx   . Rectal cancer Neg Hx    . Stomach cancer Neg Hx     SOCIAL HISTORY: Social History   Socioeconomic History  . Marital status: Single    Spouse name: Not on file  . Number of children: 1  . Years of education: college  . Highest education level: Not on file  Occupational History  . Occupation: Hair Dresser  Social Needs  . Financial resource strain: Not on file  . Food insecurity    Worry: Not on file    Inability: Not on file  . Transportation needs    Medical: Not on file    Non-medical: Not on file  Tobacco Use  . Smoking status: Current Some Day Smoker    Types: Cigars  . Smokeless tobacco: Never Used  . Tobacco comment: ocasionally smoke a cigar  Substance and Sexual Activity  . Alcohol use: No  . Drug use: No  . Sexual activity: Yes  Lifestyle  . Physical activity    Days per week: Not on file    Minutes per session: Not on file  . Stress: Not on file  Relationships  . Social Herbalist on phone: Not on file    Gets together: Not on file    Attends religious service: Not on file    Active member of club or organization: Not on file    Attends meetings of clubs or organizations: Not on file    Relationship status: Not on file  . Intimate partner violence    Fear of current or ex partner: Not on file    Emotionally abused: Not on file    Physically abused: Not on file    Forced sexual activity: Not on file  Other Topics Concern  . Not on file  Social History Narrative   Lives at home with his wife.   Right-handed.   Caffeine use:  Coffee and diet drinks throughout the day.     PHYSICAL EXAM   Vitals:   06/07/19 1601  BP: 122/79  Pulse: 74  Temp: (!) 97.3 F (36.3 C)  Weight: 160 lb 8 oz (72.8 kg)  Height: 5\' 8"  (1.727 m)    Not recorded      Body mass index is 24.4 kg/m.  PHYSICAL EXAMNIATION:  He has significant right turn, moderate left tilt, mild retrocollis  ASSESSMENT AND PLAN  Chad Kelley is a 63 y.o. male   Autosomal dominant  Charcot-Marie-Tooth disease, genetically confirmed GJB1 mutation, Cervical dystonia  Moderate right turn, left tilt, mild retrocollis Used BOTOX A 300 units.  Left sternocleidomastoid 25 unitsx2= 50 units  Right longissimus capitis 25 unitsx2=50 units Right splenius capitis 25 unitsx3= 75 units Right semispinalis 25 units  Right inferior oblique capitis 25 units Left inferior oblique capitis 25 units Left levator scapular 25 units Left splenis capitis 25 units  Patient tolerated injection well, will return to clinic in 3 months for repeat injection  Marcial Pacas, M.D. Ph.D.  [facility] [address]  [place], [place] [address] Ph: [phone] Fax: [phone]  CC: Horald Pollen, MD

## 2019-06-07 NOTE — Progress Notes (Signed)
**  Botox 100 units x 3 vials, NDC 5883-2549-8, Lot [id number], Exp 01/2022, office supply.//mck,rn**

## 2019-06-08 DIAGNOSIS — G243 Spasmodic torticollis: Secondary | ICD-10-CM

## 2019-06-08 MED ORDER — ONABOTULINUMTOXINA 100 UNITS IJ SOLR
300.0000 [IU] | Freq: Once | INTRAMUSCULAR | Status: AC
  Administered 2019-06-08: 09:00:00 300 [IU] via INTRAMUSCULAR

## 2019-06-25 DIAGNOSIS — J029 Acute pharyngitis, unspecified: Secondary | ICD-10-CM | POA: Diagnosis not present

## 2019-09-06 ENCOUNTER — Encounter: Payer: Self-pay | Admitting: Neurology

## 2019-09-06 ENCOUNTER — Other Ambulatory Visit: Payer: Self-pay

## 2019-09-06 ENCOUNTER — Ambulatory Visit: Payer: BC Managed Care – PPO | Admitting: Neurology

## 2019-09-06 VITALS — BP 137/81 | HR 78 | Temp 97.1°F | Ht 68.0 in | Wt 163.0 lb

## 2019-09-06 DIAGNOSIS — G243 Spasmodic torticollis: Secondary | ICD-10-CM

## 2019-09-06 MED ORDER — ONABOTULINUMTOXINA 100 UNITS IJ SOLR
300.0000 [IU] | Freq: Once | INTRAMUSCULAR | Status: AC
  Administered 2019-09-06: 300 [IU] via INTRAMUSCULAR

## 2019-09-06 NOTE — Progress Notes (Signed)
**  Botox 100 units x 1 vial, NDC [id number], Lot [id number], Exp 05/2022, Botox 200 units x 1 vial, NDC CY:[id number], Lot [id number], Exp 04/2022, total of 300 units, office supply.//mck,rn**

## 2019-09-06 NOTE — Progress Notes (Signed)
PATIENT: Chad Kelley DOB: 1956-08-28  Chief Complaint  Patient presents with  . Cervical Dystonia    Botox 100 units x 1 vial, Botox 200 units x 1 vial (total of 300 units) - office supply     HISTORICAL  Chad Kelley is a 63 year old male, seen in request by his primary care physician Dr. Mitchel Honour, [facility] for evaluation of cervical dystonia, wants to continue Botox injection through our office, initial evaluation was on January 04, 2019.  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, his mother, daughter, maternal cousin suffered the CMT disease  EMG nerve conduction study in February 2015: Right ulnar motor revealed prolonged latency with decreased amplitude and decreased conduction velocity within the demyelinating range, ulnar sensory nerve was absent, right radial sensory demonstrated prolonged latency decreased amplitude and decreased conduction velocity in the demyelinating range, radial motor also demonstrated prolonged latency conduction velocity within the demyelinating range, 26 m/s  In 1990s, he noticed gradual onset forceful head turning towards the right side, initially he noticed that while driving, gradually getting worse, was seen by [facility] Dr. Maudry Mayhew, began to receive EMG guided Botox injection since 2012, every 3 to 4 months, most documented injection was on March 02, 2018, I attempted to review previous MRI cervical spine CD in 2009 without success, there was no report available.  He continue to work as a Theme park manager, difficulty  closing shears, also complains of bilateral neck pain, radiating pain towards occipital region, denies bowel and bladder incontinence.   Injection pattern from [facility] on March 02 2018. Used BOTOX A 225 units   SCM Left 50 UNITS   Splenius Capitus 25  Levator Scapulae 25  Trap 40 DISPERSED IN AREAS OF PAIN HE SAID THIS HELPED A LOT LAST TIME.    SCM right NONE  Splenius Capitus 75  Levator Scapulae NONE  Trap 10   UPDATE Jan 21 2019: 1st EMG guided BOTOX A injection, used 300 units    UPDATE July 6th 2020: He responded very well to previous Botox injection on January 21, 2019, we used Botox a 300 units, today he complains of straining at left cervical region  UPDATE Sep 06 2019: He responded well to previous Botox injection in July 2020.   REVIEW OF SYSTEMS: Full 14 system review of systems performed and notable only for  As above All other review of systems were negative.  ALLERGIES: No Known Allergies  HOME MEDICATIONS: Current Outpatient Medications  Medication Sig Dispense Refill  . acetic acid-hydrocortisone (VOSOL-HC) OTIC solution Place 3 drops into the left ear 3 (three) times daily. 10 mL 1  . escitalopram (LEXAPRO) 10 MG tablet Take 1 tablet (10 mg total) by mouth daily. 30 tablet 5  . fluticasone (FLONASE) 50 MCG/ACT nasal spray 1 spray each nostril twice a day 48 g 3  . OnabotulinumtoxinA (BOTOX IM) Inject 300 Units into the muscle every 3 (three) months.     No current facility-administered medications for this visit.     PAST MEDICAL HISTORY: Past Medical History:  Diagnosis Date  .  Allergy   . Charcot-Marie-Tooth disease   . Neuromuscular disorder ([place])   . Pancreatitis     PAST SURGICAL HISTORY: Past Surgical History:  Procedure Laterality Date  . HERNIA MESH REMOVAL    . HERNIA REPAIR    . pancreatitis    . VASECTOMY      FAMILY HISTORY: Family History  Problem Relation Age of Onset  . Diabetes Mother   . Hypertension Father    . Cancer Father   . Colon cancer Neg Hx   . Esophageal cancer Neg Hx   . Pancreatic cancer Neg Hx   . Rectal cancer Neg Hx   . Stomach cancer Neg Hx     SOCIAL HISTORY: Social History   Socioeconomic History  . Marital status: Single    Spouse name: Not on file  . Number of children: 1  . Years of education: college  . Highest education level: Not on file  Occupational History  . Occupation: Hair Dresser  Social Needs  . Financial resource strain: Not on file  . Food insecurity    Worry: Not on file    Inability: Not on file  . Transportation needs    Medical: Not on file    Non-medical: Not on file  Tobacco Use  . Smoking status: Current Some Day Smoker    Types: Cigars  . Smokeless tobacco: Never Used  . Tobacco comment: ocasionally smoke a cigar  Substance and Sexual Activity  . Alcohol use: No  . Drug use: No  . Sexual activity: Yes  Lifestyle  . Physical activity    Days per week: Not on file    Minutes per session: Not on file  . Stress: Not on file  Relationships  . Social Herbalist on phone: Not on file    Gets together: Not on file    Attends religious service: Not on file    Active member of club or organization: Not on file    Attends meetings of clubs or organizations: Not on file    Relationship status: Not on file  . Intimate partner violence    Fear of current or ex partner: Not on file    Emotionally abused: Not on file    Physically abused: Not on file    Forced sexual activity: Not on file  Other Topics Concern  . Not on file  Social History Narrative   Lives at home with his wife.   Right-handed.   Caffeine use:  Coffee and diet drinks throughout the day.     PHYSICAL EXAM   Vitals:   09/06/19 0912  BP: 137/81  Pulse: 78  Temp: (!) 97.1 F (36.2 C)  Weight: 163 lb (73.9 kg)  Height: 5\' 8"  (1.727 m)    Not recorded      Body mass index is 24.78 kg/m.  PHYSICAL EXAMNIATION:  He has significant right turn,  moderate left tilt, mild retrocollis  ASSESSMENT AND PLAN  Chad Kelley is a 63 y.o. male   Autosomal dominant Charcot-Marie-Tooth disease, genetically confirmed GJB1 mutation, Cervical dystonia  Moderate right turn, left tilt, mild retrocollis Used BOTOX A 300 units.  Left sternocleidomastoid 25 unitsx2= 50 units  Right longissimus capitis 25 unitsx2=50 units Right splenius capitis 25 unitsx3= 75 units Right semispinalis 25 units  Right inferior oblique capitis 25 units Left inferior oblique capitis 25 units Left levator scapular 25 units Left splenis capitis 25 units  Patient tolerated injection well, will return  to clinic in 3 months for repeat injection  Marcial Pacas, M.D. Ph.D.  [facility] [address], [place], [place] [address] Ph: [phone] Fax: [phone]  CC: Horald Pollen, MD

## 2019-09-23 ENCOUNTER — Telehealth: Payer: Self-pay | Admitting: Emergency Medicine

## 2019-09-23 NOTE — Telephone Encounter (Signed)
I scheduled pt for a CPE in November, pt is wanting a prescription for flonase.

## 2019-09-24 MED ORDER — FLUTICASONE PROPIONATE 50 MCG/ACT NA SUSP: NASAL | 0 refills | Status: DC

## 2019-09-24 NOTE — Telephone Encounter (Signed)
Rx has been sent to [facility]

## 2019-11-01 ENCOUNTER — Other Ambulatory Visit: Payer: Self-pay

## 2019-11-01 ENCOUNTER — Ambulatory Visit (INDEPENDENT_AMBULATORY_CARE_PROVIDER_SITE_OTHER): Payer: BC Managed Care – PPO | Admitting: Emergency Medicine

## 2019-11-01 ENCOUNTER — Encounter: Payer: Self-pay | Admitting: Emergency Medicine

## 2019-11-01 VITALS — BP 120/69 | HR 65 | Temp 98.4°F | Resp 16 | Ht 67.0 in | Wt 160.6 lb

## 2019-11-01 DIAGNOSIS — Z0001 Encounter for general adult medical examination with abnormal findings: Secondary | ICD-10-CM

## 2019-11-01 DIAGNOSIS — Z13228 Encounter for screening for other metabolic disorders: Secondary | ICD-10-CM

## 2019-11-01 DIAGNOSIS — Z13220 Encounter for screening for lipoid disorders: Secondary | ICD-10-CM

## 2019-11-01 DIAGNOSIS — Z1329 Encounter for screening for other suspected endocrine disorder: Secondary | ICD-10-CM | POA: Diagnosis not present

## 2019-11-01 DIAGNOSIS — L989 Disorder of the skin and subcutaneous tissue, unspecified: Secondary | ICD-10-CM

## 2019-11-01 DIAGNOSIS — Z130 Encounter for screening for diseases of the blood and blood-forming organs and certain disorders involving the immune mechanism: Secondary | ICD-10-CM | POA: Diagnosis not present

## 2019-11-01 DIAGNOSIS — G600 Hereditary motor and sensory neuropathy: Secondary | ICD-10-CM | POA: Diagnosis not present

## 2019-11-01 DIAGNOSIS — J302 Other seasonal allergic rhinitis: Secondary | ICD-10-CM

## 2019-11-01 DIAGNOSIS — Z125 Encounter for screening for malignant neoplasm of prostate: Secondary | ICD-10-CM

## 2019-11-01 DIAGNOSIS — Z23 Encounter for immunization: Secondary | ICD-10-CM | POA: Diagnosis not present

## 2019-11-01 MED ORDER — FLUTICASONE PROPIONATE 50 MCG/ACT NA SUSP: NASAL | 10 refills | Status: AC

## 2019-11-01 NOTE — Progress Notes (Addendum)
LOGEN KYNE 63 y.o.   Chief Complaint  Patient presents with  . Annual Exam    HISTORY OF PRESENT ILLNESS: This is a 63 y.o. male here for annual exam.  Has no complaints or medical concerns. Past medical history includes Charcot-Marie-Tooth disease and sees neurologist on a regular basis. Also has a history of elevated PSA.  HPI   Prior to Admission medications   Medication Sig Start Date End Date Taking? Authorizing Provider  acetic acid-hydrocortisone (VOSOL-HC) OTIC solution Place 3 drops into the left ear 3 (three) times daily. 10/26/18  Yes Horald Pollen, MD  fluticasone [facility]) 50 MCG/ACT nasal spray 1 spray each nostril twice a day 09/24/19  Yes Shawnn Bouillon, Ines Bloomer, MD  OnabotulinumtoxinA (BOTOX IM) Inject 300 Units into the muscle every 3 (three) months.   Yes [provider]  escitalopram (LEXAPRO) 10 MG tablet Take 1 tablet (10 mg total) by mouth daily. Patient not taking: Reported on 11/01/2019 10/26/18   Horald Pollen, MD    No Known Allergies  Patient Active Problem List   Diagnosis Date Noted  . Cervical dystonia 01/04/2019  . CMT (Charcot-Marie-Tooth disease) 01/04/2019  . History of elevated PSA 10/26/2018  . Charcot-Marie-Tooth disease 10/26/2018  . Recurrent sinusitis 12/27/2014  . Other seasonal allergic rhinitis 12/27/2014    Past Medical History:  Diagnosis Date  . Allergy   . Charcot-Marie-Tooth disease   . Neuromuscular disorder ([place])   . Pancreatitis     Past Surgical History:  Procedure Laterality Date  . HERNIA MESH REMOVAL    . HERNIA REPAIR    . pancreatitis    . VASECTOMY      Social History   Socioeconomic History  . Marital status: Single    Spouse name: Not on file  . Number of children: 1  . Years of education: college  . Highest education level: Not on file  Occupational History  . Occupation: Hair Dresser  Social Needs  . Financial resource strain: Not on file  . Food insecurity   Worry: Not on file    Inability: Not on file  . Transportation needs    Medical: Not on file    Non-medical: Not on file  Tobacco Use  . Smoking status: Current Some Day Smoker    Types: Cigars  . Smokeless tobacco: Never Used  . Tobacco comment: ocasionally smoke a cigar  Substance and Sexual Activity  . Alcohol use: No  . Drug use: No  . Sexual activity: Yes  Lifestyle  . Physical activity    Days per week: Not on file    Minutes per session: Not on file  . Stress: Not on file  Relationships  . Social Herbalist on phone: Not on file    Gets together: Not on file    Attends religious service: Not on file    Active member of club or organization: Not on file    Attends meetings of clubs or organizations: Not on file    Relationship status: Not on file  . Intimate partner violence    Fear of current or ex partner: Not on file    Emotionally abused: Not on file    Physically abused: Not on file    Forced sexual activity: Not on file  Other Topics Concern  . Not on file  Social History Narrative   Lives at home with his wife.   Right-handed.   Caffeine use:  Coffee and diet drinks  throughout the day.    Family History  Problem Relation Age of Onset  . Diabetes Mother   . Hypertension Father   . Cancer Father   . Colon cancer Neg Hx   . Esophageal cancer Neg Hx   . Pancreatic cancer Neg Hx   . Rectal cancer Neg Hx   . Stomach cancer Neg Hx      Review of Systems  Constitutional: Negative.  Negative for chills and fever.  HENT: Negative.  Negative for congestion and sore throat.   Respiratory: Negative.  Negative for cough and shortness of breath.   Cardiovascular: Negative.  Negative for chest pain and palpitations.  Gastrointestinal: Negative.  Negative for abdominal pain, diarrhea, nausea and vomiting.  Genitourinary: Negative for dysuria and hematuria.  Musculoskeletal: Positive for neck pain (Chronic).  Skin: Negative.  Negative for rash.   Neurological: Negative for dizziness and headaches.  All other systems reviewed and are negative.   Vitals:   11/01/19 1631  BP: 120/69  Pulse: 65  Resp: 16  Temp: 98.4 F (36.9 C)  SpO2: 95%    Physical Exam Vitals signs reviewed.  Constitutional:      Appearance: Normal appearance.  HENT:     Head: Normocephalic.  Eyes:     Extraocular Movements: Extraocular movements intact.     Pupils: Pupils are equal, round, and reactive to light.  Neck:     Musculoskeletal: Decreased range of motion. No muscular tenderness.  Cardiovascular:     Rate and Rhythm: Normal rate and regular rhythm.     Pulses: Normal pulses.     Heart sounds: Normal heart sounds.  Pulmonary:     Effort: Pulmonary effort is normal.     Breath sounds: Normal breath sounds.  Abdominal:     Palpations: Abdomen is soft.     Tenderness: There is no abdominal tenderness.  Musculoskeletal:     Comments: Atrophy of hand muscles bilaterally with significant loss of strength of both thumbs.  Skin:    General: Skin is warm and dry.     Capillary Refill: Capillary refill takes less than 2 seconds.     Findings: Lesion (Hyperpigmented palpable lesion on right cheek area) present.  Neurological:     General: No focal deficit present.     Mental Status: He is alert and oriented to person, place, and time.  Psychiatric:        Mood and Affect: Mood normal.        Behavior: Behavior normal.      ASSESSMENT & PLAN: Het was seen today for annual exam.  Diagnoses and all orders for this visit:  Encounter for general adult medical examination with abnormal findings  Charcot-Marie-Tooth disease  Screening for deficiency anemia -     CBC with Differential  Screening for lipoid disorders -     Lipid panel  Screening for endocrine, metabolic and immunity disorder -     Comprehensive metabolic panel -     Hemoglobin A1c  Other seasonal allergic rhinitis -     fluticasone (FLONASE) 50 MCG/ACT nasal  spray; 1 spray each nostril twice a day  Need for prophylactic vaccination and inoculation against influenza -     Flu Vaccine QUAD 36+ mos IM  Prostate cancer screening -     PSA  Skin lesion -     Ambulatory referral to Dermatology     Patient Instructions       If you have lab work done today you  will be contacted with your lab results within the next 2 weeks.  If you have not heard from [place] then please contact us. The fastest way to get your results is to register for My Chart.   IF you received an x-ray today, you will receive an invoice from [facility]. Please contact [facility] at [phone] with questions or concerns regarding your invoice.   IF you received labwork today, you will receive an invoice from [facility]. Please contact [facility] at [phone] with questions or concerns regarding your invoice.   Our billing staff will not be able to assist you with questions regarding bills from these companies.  You will be contacted with the lab results as soon as they are available. The fastest way to get your results is to activate your My Chart account. Instructions are located on the last page of this paperwork. If you have not heard from [place] regarding the results in 2 weeks, please contact this office.      Health Maintenance, Male Adopting a healthy lifestyle and getting preventive care are important in promoting health and wellness. Ask your health care provider about:  The right schedule for you to have regular tests and exams.  Things you can do on your own to prevent diseases and keep yourself healthy. What should I know about diet, weight, and exercise? Eat a healthy diet   Eat a diet that includes plenty of vegetables, fruits, low-fat dairy products, and lean protein.  Do not eat a lot of foods that are high in solid fats, added sugars, or sodium. Maintain a healthy weight Body mass index (BMI) is a measurement that can be used to  identify possible weight problems. It estimates body fat based on height and weight. Your health care provider can help determine your BMI and help you achieve or maintain a healthy weight. Get regular exercise Get regular exercise. This is one of the most important things you can do for your health. Most adults should:  Exercise for at least 150 minutes each week. The exercise should increase your heart rate and make you sweat (moderate-intensity exercise).  Do strengthening exercises at least twice a week. This is in addition to the moderate-intensity exercise.  Spend less time sitting. Even light physical activity can be beneficial. Watch cholesterol and blood lipids Have your blood tested for lipids and cholesterol at 62 years of age, then have this test every 5 years. You may need to have your cholesterol levels checked more often if:  Your lipid or cholesterol levels are high.  You are older than 62 years of age.  You are at high risk for heart disease. What should I know about cancer screening? Many types of cancers can be detected early and may often be prevented. Depending on your health history and family history, you may need to have cancer screening at various ages. This may include screening for:  Colorectal cancer.  Prostate cancer.  Skin cancer.  Lung cancer. What should I know about heart disease, diabetes, and high blood pressure? Blood pressure and heart disease  High blood pressure causes heart disease and increases the risk of stroke. This is more likely to develop in people who have high blood pressure readings, are of African descent, or are overweight.  Talk with your health care provider about your target blood pressure readings.  Have your blood pressure checked: ? Every 3-5 years if you are 38-54 years of age. ? Every year if you are 40 years  old or older.  If you are between the ages of 17 and 89 and are a current or former smoker, ask your health  care provider if you should have a one-time screening for abdominal aortic aneurysm (AAA). Diabetes Have regular diabetes screenings. This checks your fasting blood sugar level. Have the screening done:  Once every three years after age 73 if you are at a normal weight and have a low risk for diabetes.  More often and at a younger age if you are overweight or have a high risk for diabetes. What should I know about preventing infection? Hepatitis B If you have a higher risk for hepatitis B, you should be screened for this virus. Talk with your health care provider to find out if you are at risk for hepatitis B infection. Hepatitis C Blood testing is recommended for:  Everyone born from [birth year] through 1964.  Anyone with known risk factors for hepatitis C. Sexually transmitted infections (STIs)  You should be screened each year for STIs, including gonorrhea and chlamydia, if: ? You are sexually active and are younger than 63 years of age. ? You are older than 62 years of age and your health care provider tells you that you are at risk for this type of infection. ? Your sexual activity has changed since you were last screened, and you are at increased risk for chlamydia or gonorrhea. Ask your health care provider if you are at risk.  Ask your health care provider about whether you are at high risk for HIV. Your health care provider may recommend a prescription medicine to help prevent HIV infection. If you choose to take medicine to prevent HIV, you should first get tested for HIV. You should then be tested every 3 months for as long as you are taking the medicine. Follow these instructions at home: Lifestyle  Do not use any products that contain nicotine or tobacco, such as cigarettes, e-cigarettes, and chewing tobacco. If you need help quitting, ask your health care provider.  Do not use street drugs.  Do not share needles.  Ask your health care provider for help if you need support or  information about quitting drugs. Alcohol use  Do not drink alcohol if your health care provider tells you not to drink.  If you drink alcohol: ? Limit how much you have to 0-2 drinks a day. ? Be aware of how much alcohol is in your drink. In the [place] one drink equals one 12 oz bottle of beer (355 mL), one 5 oz glass of wine (148 mL), or one 1 oz glass of hard liquor (44 mL). General instructions  Schedule regular health, dental, and eye exams.  Stay current with your vaccines.  Tell your health care provider if: ? You often feel depressed. ? You have ever been abused or do not feel safe at home. Summary  Adopting a healthy lifestyle and getting preventive care are important in promoting health and wellness.  Follow your health care provider's instructions about healthy diet, exercising, and getting tested or screened for diseases.  Follow your health care provider's instructions on monitoring your cholesterol and blood pressure. This information is not intended to replace advice given to you by your health care provider. Make sure you discuss any questions you have with your health care provider. Document Released: 05/16/2008 Document Revised: 11/11/2018 Document Reviewed: 11/11/2018 Elsevier Patient Education  2020 [employer]vier Inc.      Agustina Caroli, MD [facility]

## 2019-11-01 NOTE — Addendum Note (Signed)
Addended by: Davina Poke on: 11/01/2019 05:21 PM   Modules accepted: Orders

## 2019-11-01 NOTE — Patient Instructions (Addendum)
   If you have lab work done today you will be contacted with your lab results within the next 2 weeks.  If you have not heard from us then please contact us. The fastest way to get your results is to register for My Chart.   IF you received an x-ray today, you will receive an invoice from [facility]. Please contact [facility] at [phone] with questions or concerns regarding your invoice.   IF you received labwork today, you will receive an invoice from [facility]. Please contact [facility] at [phone] with questions or concerns regarding your invoice.   Our billing staff will not be able to assist you with questions regarding bills from these companies.  You will be contacted with the lab results as soon as they are available. The fastest way to get your results is to activate your My Chart account. Instructions are located on the last page of this paperwork. If you have not heard from us regarding the results in 2 weeks, please contact this office.     Health Maintenance, Male Adopting a healthy lifestyle and getting preventive care are important in promoting health and wellness. Ask your health care provider about:  The right schedule for you to have regular tests and exams.  Things you can do on your own to prevent diseases and keep yourself healthy. What should I know about diet, weight, and exercise? Eat a healthy diet   Eat a diet that includes plenty of vegetables, fruits, low-fat dairy products, and lean protein.  Do not eat a lot of foods that are high in solid fats, added sugars, or sodium. Maintain a healthy weight Body mass index (BMI) is a measurement that can be used to identify possible weight problems. It estimates body fat based on height and weight. Your health care provider can help determine your BMI and help you achieve or maintain a healthy weight. Get regular exercise Get regular exercise. This is one of the most important things you  can do for your health. Most adults should:  Exercise for at least 150 minutes each week. The exercise should increase your heart rate and make you sweat (moderate-intensity exercise).  Do strengthening exercises at least twice a week. This is in addition to the moderate-intensity exercise.  Spend less time sitting. Even light physical activity can be beneficial. Watch cholesterol and blood lipids Have your blood tested for lipids and cholesterol at 62 years of age, then have this test every 5 years. You may need to have your cholesterol levels checked more often if:  Your lipid or cholesterol levels are high.  You are older than 62 years of age.  You are at high risk for heart disease. What should I know about cancer screening? Many types of cancers can be detected early and may often be prevented. Depending on your health history and family history, you may need to have cancer screening at various ages. This may include screening for:  Colorectal cancer.  Prostate cancer.  Skin cancer.  Lung cancer. What should I know about heart disease, diabetes, and high blood pressure? Blood pressure and heart disease  High blood pressure causes heart disease and increases the risk of stroke. This is more likely to develop in people who have high blood pressure readings, are of African descent, or are overweight.  Talk with your health care provider about your target blood pressure readings.  Have your blood pressure checked: ? Every 3-5 years if you are 18-39 years   of age. ? Every year if you are 40 years old or older.  If you are between the ages of 65 and 75 and are a current or former smoker, ask your health care provider if you should have a one-time screening for abdominal aortic aneurysm (AAA). Diabetes Have regular diabetes screenings. This checks your fasting blood sugar level. Have the screening done:  Once every three years after age 45 if you are at a normal weight and have  a low risk for diabetes.  More often and at a younger age if you are overweight or have a high risk for diabetes. What should I know about preventing infection? Hepatitis B If you have a higher risk for hepatitis B, you should be screened for this virus. Talk with your health care provider to find out if you are at risk for hepatitis B infection. Hepatitis C Blood testing is recommended for:  Everyone born from 1945 through 1965.  Anyone with known risk factors for hepatitis C. Sexually transmitted infections (STIs)  You should be screened each year for STIs, including gonorrhea and chlamydia, if: ? You are sexually active and are younger than 62 years of age. ? You are older than 62 years of age and your health care provider tells you that you are at risk for this type of infection. ? Your sexual activity has changed since you were last screened, and you are at increased risk for chlamydia or gonorrhea. Ask your health care provider if you are at risk.  Ask your health care provider about whether you are at high risk for HIV. Your health care provider may recommend a prescription medicine to help prevent HIV infection. If you choose to take medicine to prevent HIV, you should first get tested for HIV. You should then be tested every 3 months for as long as you are taking the medicine. Follow these instructions at home: Lifestyle  Do not use any products that contain nicotine or tobacco, such as cigarettes, e-cigarettes, and chewing tobacco. If you need help quitting, ask your health care provider.  Do not use street drugs.  Do not share needles.  Ask your health care provider for help if you need support or information about quitting drugs. Alcohol use  Do not drink alcohol if your health care provider tells you not to drink.  If you drink alcohol: ? Limit how much you have to 0-2 drinks a day. ? Be aware of how much alcohol is in your drink. In the [place] one drink equals one 12  oz bottle of beer (355 mL), one 5 oz glass of wine (148 mL), or one 1 oz glass of hard liquor (44 mL). General instructions  Schedule regular health, dental, and eye exams.  Stay current with your vaccines.  Tell your health care provider if: ? You often feel depressed. ? You have ever been abused or do not feel safe at home. Summary  Adopting a healthy lifestyle and getting preventive care are important in promoting health and wellness.  Follow your health care provider's instructions about healthy diet, exercising, and getting tested or screened for diseases.  Follow your health care provider's instructions on monitoring your cholesterol and blood pressure. This information is not intended to replace advice given to you by your health care provider. Make sure you discuss any questions you have with your health care provider. Document Released: 05/16/2008 Document Revised: 11/11/2018 Document Reviewed: 11/11/2018 Elsevier Patient Education  2020 Elsevier Inc.  

## 2019-11-02 LAB — COMPREHENSIVE METABOLIC PANEL
ALT: 25 IU/L (ref 0–44)
AST: 20 IU/L (ref 0–40)
Albumin/Globulin Ratio: 1.5 (ref 1.2–2.2)
Albumin: 4.4 g/dL (ref 3.8–4.8)
Alkaline Phosphatase: 65 IU/L (ref 39–117)
BUN/Creatinine Ratio: 17 (ref 10–24)
BUN: 12 mg/dL (ref 8–27)
Bilirubin Total: 0.5 mg/dL (ref 0.0–1.2)
CO2: 17 mmol/L — ABNORMAL LOW (ref 20–29)
Calcium: 9.3 mg/dL (ref 8.6–10.2)
Chloride: 99 mmol/L (ref 96–106)
Creatinine, Ser: 0.72 mg/dL — ABNORMAL LOW (ref 0.76–1.27)
GFR calc Af Amer: 116 mL/min/{1.73_m2} (ref >59)
GFR calc non Af Amer: 100 mL/min/{1.73_m2} (ref >59)
Globulin, Total: 2.9 g/dL (ref 1.5–4.5)
Glucose: 86 mg/dL (ref 65–99)
Potassium: 3.8 mmol/L (ref 3.5–5.2)
Sodium: 140 mmol/L (ref 134–144)
Total Protein: 7.3 g/dL (ref 6.0–8.5)

## 2019-11-02 LAB — CBC WITH DIFFERENTIAL/PLATELET
Basophils Absolute: 0.1 10*3/uL (ref 0.0–0.2)
Basos: 1 %
EOS (ABSOLUTE): 0.2 10*3/uL (ref 0.0–0.4)
Eos: 3 %
Hematocrit: 49.8 % (ref 37.5–51.0)
Hemoglobin: 17.9 g/dL — ABNORMAL HIGH (ref 13.0–17.7)
Immature Grans (Abs): 0 10*3/uL (ref 0.0–0.1)
Immature Granulocytes: 0 %
Lymphocytes Absolute: 1.7 10*3/uL (ref 0.7–3.1)
Lymphs: 23 %
MCH: 33.1 pg — ABNORMAL HIGH (ref 26.6–33.0)
MCHC: 35.9 g/dL — ABNORMAL HIGH (ref 31.5–35.7)
MCV: 92 fL (ref 79–97)
Monocytes Absolute: 0.5 10*3/uL (ref 0.1–0.9)
Monocytes: 7 %
Neutrophils Absolute: 4.7 10*3/uL (ref 1.4–7.0)
Neutrophils: 66 %
Platelets: 226 10*3/uL (ref 150–450)
RBC: 5.41 x10E6/uL (ref 4.14–5.80)
RDW: 12.6 % (ref 11.6–15.4)
WBC: 7.1 10*3/uL (ref 3.4–10.8)

## 2019-11-02 LAB — LIPID PANEL
Chol/HDL Ratio: 3.8 ratio (ref 0.0–5.0)
Cholesterol, Total: 187 mg/dL (ref 100–199)
HDL: 49 mg/dL (ref >39)
LDL Chol Calc (NIH): 106 mg/dL — ABNORMAL HIGH (ref 0–99)
Triglycerides: 184 mg/dL — ABNORMAL HIGH (ref 0–149)
VLDL Cholesterol Cal: 32 mg/dL (ref 5–40)

## 2019-11-02 LAB — PSA: Prostate Specific Ag, Serum: 2.4 ng/mL (ref 0.0–4.0)

## 2019-11-02 LAB — HEMOGLOBIN A1C
Est. average glucose Bld gHb Est-mCnc: 103 mg/dL
Hgb A1c MFr Bld: 5.2 % (ref 4.8–5.6)

## 2019-11-29 DIAGNOSIS — Z0271 Encounter for disability determination: Secondary | ICD-10-CM

## 2019-11-30 ENCOUNTER — Ambulatory Visit (INDEPENDENT_AMBULATORY_CARE_PROVIDER_SITE_OTHER): Payer: BC Managed Care – PPO | Admitting: Neurology

## 2019-11-30 ENCOUNTER — Other Ambulatory Visit: Payer: Self-pay

## 2019-11-30 ENCOUNTER — Encounter: Payer: Self-pay | Admitting: Neurology

## 2019-11-30 VITALS — BP 132/72 | HR 68 | Temp 97.5°F | Ht 67.0 in | Wt 167.5 lb

## 2019-11-30 DIAGNOSIS — G243 Spasmodic torticollis: Secondary | ICD-10-CM

## 2019-11-30 MED ORDER — ONABOTULINUMTOXINA 100 UNITS IJ SOLR
300.0000 [IU] | Freq: Once | INTRAMUSCULAR | Status: AC
  Administered 2019-11-30: 300 [IU] via INTRAMUSCULAR

## 2019-11-30 NOTE — Progress Notes (Signed)
PATIENT: Chad Kelley DOB: 16-May-1956  Chief Complaint  Patient presents with  . Cervical Dystonia    Botox 100 units x 1 vial, Botox 200 units x 1 vial - office supply     HISTORICAL  Chad Kelley is a 63 year old male, seen in request by his primary care physician Dr. Mitchel Honour, [facility] for evaluation of cervical dystonia, wants to continue Botox injection through our office, initial evaluation was on January 04, 2019.  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, his mother, daughter, maternal cousin suffered the CMT disease  EMG nerve conduction study in February 2015: Right ulnar motor revealed prolonged latency with decreased amplitude and decreased conduction velocity within the demyelinating range, ulnar sensory nerve was absent, right radial sensory demonstrated prolonged latency decreased amplitude and decreased conduction velocity in the demyelinating range, radial motor also demonstrated prolonged latency conduction velocity within the demyelinating range, 26 m/s  In 1990s, he noticed gradual onset forceful head turning towards the right side, initially he noticed that while driving, gradually getting worse, was [facility] by [facility] Dr. Maudry Mayhew, began to receive EMG guided Botox injection since 2012, every 3 to 4 months, most documented injection was on March 02, 2018, I attempted to review previous MRI cervical spine CD in 2009 without success, there was no report available.  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   Injection pattern from [facility] on March 02 2018. Used BOTOX A 225 units   SCM Left 50 UNITS   Splenius Capitus 25  Levator Scapulae 25  Trap 40 DISPERSED IN AREAS OF PAIN HE SAID THIS HELPED A LOT LAST TIME.    SCM right NONE  Splenius Capitus 75  Levator Scapulae NONE  Trap 10   UPDATE Jan 21 2019: 1st EMG guided BOTOX A injection, used 300 units    UPDATE July 6th 2020: He responded very well to previous Botox injection on January 21, 2019, we used Botox a 300 units, today he complains of straining at left cervical region  UPDATE Sep 06 2019: He responded well to previous Botox injection in July 2020.  UPDATE Nov 30 2019: He responded very well to previous injection, there was no significant side effect noted.  We used Botox 300 units   REVIEW OF SYSTEMS: Full 14 system review of systems performed and notable only for  As above All other review of systems were negative.  ALLERGIES: No Known Allergies  HOME MEDICATIONS: Current Outpatient Medications  Medication Sig Dispense Refill  . acetic acid-hydrocortisone (VOSOL-HC) OTIC solution Place 3 drops into the left ear 3 (three) times daily. 10 mL 1  . escitalopram (LEXAPRO) 10 MG tablet Take 1 tablet (10 mg total) by mouth daily. 30 tablet 5  . fluticasone (FLONASE) 50 MCG/ACT nasal spray 1 spray each nostril twice a day 18.2 mL 10  . OnabotulinumtoxinA (BOTOX IM) Inject 300 Units into the muscle every 3 (three) months.     No  current facility-administered medications for this visit.    PAST MEDICAL HISTORY: Past Medical History:  Diagnosis Date  . Allergy   . Charcot-Marie-Tooth disease   . Neuromuscular disorder ([place])   . Pancreatitis     PAST SURGICAL HISTORY: Past Surgical History:  Procedure Laterality Date  . HERNIA MESH REMOVAL    . HERNIA REPAIR    . pancreatitis    .  VASECTOMY      FAMILY HISTORY: Family History  Problem Relation Age of Onset  . Diabetes Mother   . Hypertension Father   . Cancer Father   . Colon cancer Neg Hx   . Esophageal cancer Neg Hx   . Pancreatic cancer Neg Hx   . Rectal cancer Neg Hx   . Stomach cancer Neg Hx     SOCIAL HISTORY: Social History   Socioeconomic History  . Marital status: Single    Spouse name: Not on file  . Number of children: 1  . Years of education: college  . Highest education level: Not on file  Occupational History  . Occupation: Hair Dresser  Tobacco Use  . Smoking status: Current Some Day Smoker    Types: Cigars  . Smokeless tobacco: Never Used  . Tobacco comment: ocasionally smoke a cigar  Substance and Sexual Activity  . Alcohol use: No  . Drug use: No  . Sexual activity: Yes  Other Topics Concern  . Not on file  Social History Narrative   Lives at home with his wife.   Right-handed.   Caffeine use:  Coffee and diet drinks throughout the day.   Social Determinants of Health   Financial Resource Strain:   . Difficulty of Paying Living Expenses: Not on file  Food Insecurity:   . Worried About Charity fundraiser in the Last Year: Not on file  . Ran Out of Food in the Last Year: Not on file  Transportation Needs:   . Lack of Transportation (Medical): Not on file  . Lack of Transportation (Non-Medical): Not on file  Physical Activity:   . Days of Exercise per Week: Not on file  . Minutes of Exercise per Session: Not on file  Stress:   . Feeling of Stress : Not on file  Social Connections:   . Frequency of Communication with Friends and Family: Not on file  . Frequency of Social Gatherings with Friends and Family: Not on file  . Attends Religious Services: Not on file  . Active Member of Clubs or Organizations: Not on file  . Attends Archivist Meetings: Not on file  . Marital Status: Not on file  Intimate Partner Violence:   . Fear of Current or Ex-Partner:  Not on file  . Emotionally Abused: Not on file  . Physically Abused: Not on file  . Sexually Abused: Not on file     PHYSICAL EXAM   Vitals:   11/30/19 1314  BP: 132/72  Pulse: 68  Temp: (!) 97.5 F (36.4 C)  Weight: 167 lb 8 oz (76 kg)  Height: 5\' 7"  (1.702 m)    Not recorded      Body mass index is 26.23 kg/m.  PHYSICAL EXAMNIATION:  He has significant right turn, moderate left tilt, mild retrocollis  ASSESSMENT AND PLAN  Chad Kelley is a 63 y.o. male   Autosomal dominant Charcot-Marie-Tooth disease, genetically confirmed GJB1 mutation, Cervical dystonia  Moderate right turn, left tilt, mild retrocollis Used BOTOX A 300 units.  Right longissimus  capitis 25 unitsx2=50 units Right splenius capitis 25 unitsx2= 50 units Right splenius cervix 25 unitsx2=50 unitis Right semispinalis 25 units Right inferior oblique capitis 25 units Right levator scapular 25 units  Left sternocleidomastoid 25 units x 3=75 units    Patient tolerated injection well, will return to clinic in 3 months for repeat injection  Marcial Pacas, M.D. Ph.D.  [facility] [address], [place], [place] [address] Ph: [phone] Fax: [phone]  CC: Horald Pollen, MD

## 2019-11-30 NOTE — Progress Notes (Signed)
**  Botox 100 units x 1 vial, NDC [id number], Lot [id number], Exp 01/2022, Botox 200 units x 1 vial, NDC [id number], Lot [id number], Exp 07/2022, total of 300 units, office supply.//mck,rn**

## 2019-12-01 ENCOUNTER — Ambulatory Visit: Payer: Self-pay | Admitting: Neurology

## 2019-12-23 DIAGNOSIS — Z0271 Encounter for disability determination: Secondary | ICD-10-CM

## 2020-02-23 ENCOUNTER — Telehealth: Payer: Self-pay | Admitting: *Deleted

## 2020-02-23 NOTE — Telephone Encounter (Signed)
Patient called back with updated insurance information.  I called [employer] [phone] and spoke to [place].  She sates that [id number] is billable but does require PA.  [id number] is billable and does not require PA.  I called [phone] and spoke to [place] to obtain the PA.  PA# is [id number] and is valid from 3/34/2021-02/21/2021 and is B/B.

## 2020-02-23 NOTE — Telephone Encounter (Signed)
I called patient to check on his insurance.  I explained to him that the Victor we have on file is coming back as inactive.  He seemed confused by this stating it should be the same but he would need to call me back.  I asked him to call as soon as he can as this is time sensitive since his appointment is next week.  He states he will call bask as soon as he can.

## 2020-03-01 ENCOUNTER — Ambulatory Visit (INDEPENDENT_AMBULATORY_CARE_PROVIDER_SITE_OTHER): Payer: 59 | Admitting: Neurology

## 2020-03-01 ENCOUNTER — Other Ambulatory Visit: Payer: Self-pay

## 2020-03-01 ENCOUNTER — Encounter: Payer: Self-pay | Admitting: Neurology

## 2020-03-01 VITALS — BP 132/82 | HR 71 | Temp 96.0°F | Ht 67.0 in | Wt 162.0 lb

## 2020-03-01 DIAGNOSIS — G243 Spasmodic torticollis: Secondary | ICD-10-CM | POA: Diagnosis not present

## 2020-03-01 DIAGNOSIS — G600 Hereditary motor and sensory neuropathy: Secondary | ICD-10-CM

## 2020-03-01 MED ORDER — ONABOTULINUMTOXINA 100 UNITS IJ SOLR
300.0000 [IU] | Freq: Once | INTRAMUSCULAR | Status: AC
  Administered 2020-03-01: 300 [IU] via INTRAMUSCULAR

## 2020-03-01 NOTE — Progress Notes (Signed)
PATIENT: Chad Kelley DOB: 07-Jan-1956  Chief Complaint  Patient presents with  . Cervical Dystonia    Botox 100 units x 3 vials     HISTORICAL  Chad Kelley is a 64 year old male, seen in request by his primary care physician Dr. Mitchel Honour, [facility] for evaluation of cervical dystonia, wants to continue Botox injection through our office, initial evaluation was on January 04, 2019.  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, his mother, daughter, maternal cousin suffered the CMT disease  EMG nerve conduction study in February 2015: Right ulnar motor revealed prolonged latency with decreased amplitude and decreased conduction velocity within the demyelinating range, ulnar sensory nerve was absent, right radial sensory demonstrated prolonged latency decreased amplitude and decreased conduction velocity in the demyelinating range, radial motor also demonstrated prolonged latency conduction velocity within the demyelinating range, 26 m/s  In 1990s, he noticed gradual onset forceful head turning towards the right side, initially he noticed that while driving, gradually getting worse, was seen by [facility] Dr. Maudry Mayhew, began to receive EMG guided Botox injection since 2012, every 3 to 4 months, most documented injection was on March 02, 2018, I attempted to review previous MRI cervical spine CD in 2009 without success, there was no report available.  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   Injection pattern from [facility] on March 02 2018. Used BOTOX A 225 units   SCM Left 50 UNITS   Splenius Capitus 25  Levator Scapulae 25  Trap 40 DISPERSED IN AREAS OF PAIN HE SAID THIS HELPED A LOT LAST TIME.    SCM right NONE  Splenius Capitus 75  Levator Scapulae NONE  Trap 10   UPDATE Jan 21 2019: 1st EMG guided BOTOX A injection, used 300 units    UPDATE July 6th 2020: He responded very well to previous Botox injection on January 21, 2019, we used Botox a 300 units, today he complains of straining at left cervical region  UPDATE Sep 06 2019: He responded well to previous Botox injection in July 2020.  UPDATE Nov 30 2019: He responded very well to previous injection, there was no significant side effect noted.  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  ALLERGIES: No Known Allergies  HOME MEDICATIONS: Current Outpatient Medications  Medication Sig Dispense Refill  . acetic acid-hydrocortisone (VOSOL-HC) OTIC solution Place 3 drops into the left ear 3 (three) times daily. 10 mL 1  . escitalopram (LEXAPRO) 10 MG tablet Take 1  tablet (10 mg total) by mouth daily. 30 tablet 5  . fluticasone (FLONASE) 50 MCG/ACT nasal spray 1 spray each nostril twice a day 18.2 mL 10  . OnabotulinumtoxinA (BOTOX IM) Inject 300 Units into the muscle every 3 (three) months.     No current facility-administered  medications for this visit.    PAST MEDICAL HISTORY: Past Medical History:  Diagnosis Date  . Allergy   . Charcot-Marie-Tooth disease   . Neuromuscular disorder ([place])   . Pancreatitis     PAST SURGICAL HISTORY: Past Surgical History:  Procedure Laterality Date  . HERNIA MESH REMOVAL    . HERNIA REPAIR    . pancreatitis    . VASECTOMY      FAMILY HISTORY: Family History  Problem Relation Age of Onset  . Diabetes Mother   . Hypertension Father   . Cancer Father   . Colon cancer Neg Hx   . Esophageal cancer Neg Hx   . Pancreatic cancer Neg Hx   . Rectal cancer Neg Hx   . Stomach cancer Neg Hx     SOCIAL HISTORY: Social History   Socioeconomic History  . Marital status: Single    Spouse name: Not on file  . Number of children: 1  . Years of education: college  . Highest education level: Not on file  Occupational History  . Occupation: Hair Dresser  Tobacco Use  . Smoking status: Current Some Day Smoker    Types: Cigars  . Smokeless tobacco: Never Used  . Tobacco comment: ocasionally smoke a cigar  Substance and Sexual Activity  . Alcohol use: No  . Drug use: No  . Sexual activity: Yes  Other Topics Concern  . Not on file  Social History Narrative   Lives at home with his wife.   Right-handed.   Caffeine use:  Coffee and diet drinks throughout the day.   Social Determinants of Health   Financial Resource Strain:   . Difficulty of Paying Living Expenses:   Food Insecurity:   . Worried About Charity fundraiser in the Last Year:   . Arboriculturist in the Last Year:   Transportation Needs:   . Film/video editor (Medical):   Marland Kitchen Lack of Transportation (Non-Medical):   Physical Activity:   . Days of Exercise per Week:   . Minutes of Exercise per Session:   Stress:   . Feeling of Stress :   Social Connections:   . Frequency of Communication with Friends and Family:   . Frequency of Social Gatherings with Friends and Family:   . Attends Religious  Services:   . Active Member of Clubs or Organizations:   . Attends Archivist Meetings:   Marland Kitchen Marital Status:   Intimate Partner Violence:   . Fear of Current or Ex-Partner:   . Emotionally Abused:   Marland Kitchen Physically Abused:   . Sexually Abused:      PHYSICAL EXAM   Vitals:   03/01/20 1450  BP: 132/82  Pulse: 71  Temp: (!) 96 F (35.6 C)  Weight: 162 lb (73.5 kg)  Height: 5\' 7"  (1.702 m)    Not recorded      Body mass index is 25.37 kg/m.  PHYSICAL EXAMNIATION:  PHYSICAL EXAMNIATION:  Gen: NAD, conversant, well nourised, well groomed                     Cardiovascular: Regular rate rhythm, no peripheral edema, warm, nontender. Eyes: Conjunctivae  clear without exudates or hemorrhage Neck: Supple, no carotid bruits. Pulmonary: Clear to auscultation bilaterally   NEUROLOGICAL EXAM:  MENTAL STATUS: Speech:    Speech is normal; fluent and spontaneous with normal comprehension.  Cognition:     Orientation to time, place and person     Normal recent and remote memory     Normal Attention span and concentration     Normal Language, naming, repeating,spontaneous speech     Fund of knowledge   CRANIAL NERVES: CN II: Visual fields are full to confrontation.  Pupils are round equal and briskly reactive to light. CN III, IV, VI: extraocular movement are normal. No ptosis. CN V: Facial sensation is intact to pinprick in all 3 divisions bilaterally. Corneal responses are intact.  CN VII: Face is symmetric with normal eye closure and smile. CN VIII: Hearing is normal to casual conversation CN IX, X: Palate elevates symmetrically. Phonation is normal. CN XI: Head turning and shoulder shrug are intact CN XII: Tongue is midline with normal movements and no atrophy.  MOTOR: Significant atrophy of bilateral intrinsic hand muscles, mild bilateral wrist flexion weakness, performed finger abduction, adduction weakness, mild bilateral hands grip weakness.  Atrophy of distal  leg muscles below knee, wear rigid bilateral AFO, also has mild bilateral hip flexion weakness.  REFLEXES: Reflexes are present and symmetric at the biceps 1/1, absent at triceps, knees, and ankles.   SENSORY: Absent vibratory sensation even to knee level, decreased vibratory sensation to wrist level, length dependent decreased light touch, pinprick to distal fine, and the mid forearm  COORDINATION: Rapid alternating movements and fine finger movements are intact. There is no dysmetria on finger-to-nose and heel-knee-shin.    GAIT/STANCE: He needs push-up to get up from seated position, wear bilateral AFO, wide-based, cautious unsteady gait  Cervical dystonia:  He has significant right turn, moderate left tilt, mild retrocollis  ASSESSMENT AND PLAN  Chad Kelley is a 64 y.o. male   Autosomal dominant Charcot-Marie-Tooth disease, genetically confirmed GJB1 mutation,  Progressive worsening bilateral upper and lower extremity sensory loss, weakness, gait abnormality, especially bilateral hand muscle weakness, difficult to make a tight grip, difficulty with finger abduction, adduction.  Cervical dystonia  Moderately severe right turn, moderate left tilt, mild retrocollis, hypertrophy of left lateral and posterior cervical muscles  Used BOTOX A 300 units.  Left longissimus capitis 25 units  Left splenius capitis 25 unitsx2= 50 units Left splenius cervix 25 units  Left semispinalis 25 units  Right inferior oblique capitis 25 units Right inferior oblique capitis 25 units Right levator scapular 25 units  Left sternocleidomastoid 25 units x 4= 100 units    Patient tolerated injection well, will return to clinic in 3 months for repeat injection  Marcial Pacas, M.D. Ph.D.  [facility] [address], [place], [place] [address] Ph: [phone] Fax: [phone]  CC: Horald Pollen, MD

## 2020-03-01 NOTE — Progress Notes (Signed)
**  Botox 100 units x 3 vials, NDC [id number], Lot [id number], Exp 10/2022, office supply.//mck,rn**

## 2020-06-07 ENCOUNTER — Telehealth: Payer: Self-pay | Admitting: Neurology

## 2020-06-07 ENCOUNTER — Ambulatory Visit (INDEPENDENT_AMBULATORY_CARE_PROVIDER_SITE_OTHER): Payer: 59 | Admitting: Neurology

## 2020-06-07 ENCOUNTER — Encounter: Payer: Self-pay | Admitting: Neurology

## 2020-06-07 VITALS — BP 124/77 | HR 75 | Ht 67.0 in | Wt 161.0 lb

## 2020-06-07 DIAGNOSIS — G600 Hereditary motor and sensory neuropathy: Secondary | ICD-10-CM

## 2020-06-07 DIAGNOSIS — G243 Spasmodic torticollis: Secondary | ICD-10-CM

## 2020-06-07 MED ORDER — ONABOTULINUMTOXINA 100 UNITS IJ SOLR
300.0000 [IU] | Freq: Once | INTRAMUSCULAR | Status: AC
  Administered 2020-06-07: 300 [IU] via INTRAMUSCULAR

## 2020-06-07 NOTE — Progress Notes (Signed)
**  Botox 100 units x 3 vials, NDC 5208-0223-36, Lot [id number], Exp 09/2022, office supply.//mck,rn**

## 2020-06-07 NOTE — Progress Notes (Addendum)
PATIENT: Chad Kelley DOB: 1956-05-18  Chief Complaint  Patient presents with  . Cervical Dystonia    Botox      HISTORICAL  Chad Kelley is a 64 year old male, seen in request by his primary care physician Dr. Mitchel Honour, [facility] for evaluation of cervical dystonia, wants to continue Botox injection through our office, initial evaluation was on January 04, 2019.  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, his mother, daughter, maternal cousin suffered the CMT disease  EMG nerve conduction study in February 2015: Right ulnar motor revealed prolonged latency with decreased amplitude and decreased conduction velocity within the demyelinating range, ulnar sensory nerve was absent, right radial sensory demonstrated prolonged latency decreased amplitude and decreased conduction velocity in the demyelinating range, radial motor also demonstrated prolonged latency conduction velocity within the demyelinating range, 26 m/s  In 1990s, he noticed gradual onset forceful head turning towards the right side, initially he noticed that while driving, gradually getting worse, was seen by [facility] Dr. Maudry Mayhew, began to receive EMG guided Botox injection since 2012, every 3 to 4 months, most documented injection was on March 02, 2018, I attempted to review previous MRI cervical spine CD in 2009 without success, there was no report available.  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   Injection pattern from [facility] on March 02 2018. Used BOTOX A 225 units   SCM Left 50 UNITS   Splenius Capitus 25  Levator Scapulae 25  Trap 40 DISPERSED IN AREAS OF PAIN HE SAID THIS HELPED A LOT LAST TIME.    SCM right NONE  Splenius Capitus 75  Levator Scapulae NONE  Trap 10   UPDATE Jan 21 2019: 1st EMG guided BOTOX A injection, used 300 units    UPDATE July 6th 2020: He responded very well to previous Botox injection on January 21, 2019, we used Botox a 300 units, today he complains of straining at left cervical region  UPDATE Sep 06 2019: He responded well to previous Botox injection in July 2020.  UPDATE Nov 30 2019: He responded very well to previous injection, there was no significant side effect noted.  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  ALLERGIES: No Known Allergies  HOME MEDICATIONS: Current Outpatient Medications  Medication Sig Dispense Refill  . acetic acid-hydrocortisone (VOSOL-HC) OTIC solution Place 3 drops into the left ear 3 (three) times daily. 10 mL 1  .  escitalopram (LEXAPRO) 10 MG tablet Take 1 tablet (10 mg total) by mouth daily. 30 tablet 5  . fluticasone (FLONASE) 50 MCG/ACT nasal spray 1 spray each nostril twice a day 18.2 mL 10  . OnabotulinumtoxinA (BOTOX IM) Inject 300 Units into the muscle every 3 (three) months.      No current facility-administered medications for this visit.    PAST MEDICAL HISTORY: Past Medical History:  Diagnosis Date  . Allergy   . Charcot-Marie-Tooth disease   . Neuromuscular disorder ([place])   . Pancreatitis     PAST SURGICAL HISTORY: Past Surgical History:  Procedure Laterality Date  . HERNIA MESH REMOVAL    . HERNIA REPAIR    . pancreatitis    . VASECTOMY      FAMILY HISTORY: Family History  Problem Relation Age of Onset  . Diabetes Mother   . Hypertension Father   . Cancer Father   . Colon cancer Neg Hx   . Esophageal cancer Neg Hx   . Pancreatic cancer Neg Hx   . Rectal cancer Neg Hx   . Stomach cancer Neg Hx     SOCIAL HISTORY: Social History   Socioeconomic History  . Marital status: Single    Spouse name: Not on file  . Number of children: 1  . Years of education: college  . Highest education level: Not on file  Occupational History  . Occupation: Hair Dresser  Tobacco Use  . Smoking status: Current Some Day Smoker    Types: Cigars  . Smokeless tobacco: Never Used  . Tobacco comment: ocasionally smoke a cigar  Vaping Use  . Vaping Use: Never used  Substance and Sexual Activity  . Alcohol use: No  . Drug use: No  . Sexual activity: Yes  Other Topics Concern  . Not on file  Social History Narrative   Lives at home with his wife.   Right-handed.   Caffeine use:  Coffee and diet drinks throughout the day.   Social Determinants of Health   Financial Resource Strain:   . Difficulty of Paying Living Expenses:   Food Insecurity:   . Worried About Charity fundraiser in the Last Year:   . Arboriculturist in the Last Year:   Transportation Needs:   . Film/video editor (Medical):   Marland Kitchen Lack of Transportation (Non-Medical):   Physical Activity:   . Days of Exercise per Week:   . Minutes of Exercise per Session:   Stress:   . Feeling of Stress :   Social Connections:   . Frequency of Communication with Friends and Family:   .  Frequency of Social Gatherings with Friends and Family:   . Attends Religious Services:   . Active Member of Clubs or Organizations:   . Attends Archivist Meetings:   Marland Kitchen Marital Status:   Intimate Partner Violence:   . Fear of Current or Ex-Partner:   . Emotionally Abused:   Marland Kitchen Physically Abused:   . Sexually Abused:      PHYSICAL EXAM   Vitals:   06/07/20 1535  BP: 124/77  Pulse: 75  Weight: 161 lb (73 kg)  Height: 5\' 7"  (1.702 m)   Not recorded     Body mass index is 25.22 kg/m.  PHYSICAL EXAMNIATION:  PHYSICAL EXAMNIATION:  Gen: NAD, conversant, well nourised, well groomed                     Cardiovascular: Regular rate rhythm,  no peripheral edema, warm, nontender. Eyes: Conjunctivae clear without exudates or hemorrhage Neck: Supple, no carotid bruits. Pulmonary: Clear to auscultation bilaterally   NEUROLOGICAL EXAM:  MENTAL STATUS: Speech:    Speech is normal; fluent and spontaneous with normal comprehension.  Cognition:     Orientation to time, place and person     Normal recent and remote memory     Normal Attention span and concentration     Normal Language, naming, repeating,spontaneous speech     Fund of knowledge   CRANIAL NERVES: CN II: Visual fields are full to confrontation.  Pupils are round equal and briskly reactive to light. CN III, IV, VI: extraocular movement are normal. No ptosis. CN V: Facial sensation is intact to pinprick in all 3 divisions bilaterally. Corneal responses are intact.  CN VII: Face is symmetric with normal eye closure and smile. CN VIII: Hearing is normal to casual conversation CN IX, X: Palate elevates symmetrically. Phonation is normal. CN XI: Head turning and shoulder shrug are intact CN XII: Tongue is midline with normal movements and no atrophy.  MOTOR: Significant atrophy of bilateral intrinsic hand muscles, mild bilateral wrist flexion weakness, performed finger abduction, adduction weakness, mild  bilateral hands grip weakness.  Atrophy of distal leg muscles below knee, wear rigid bilateral AFO, also has mild bilateral hip flexion weakness.  REFLEXES: Reflexes are present and symmetric at the biceps 1/1, absent at triceps, knees, and ankles.   SENSORY: Absent vibratory sensation even to knee level, decreased vibratory sensation to wrist level, length dependent decreased light touch, pinprick to distal fine, and the mid forearm  COORDINATION: Rapid alternating movements and fine finger movements are intact. There is no dysmetria on finger-to-nose and heel-knee-shin.    GAIT/STANCE: He needs push-up to get up from seated position, wear bilateral AFO, wide-based, cautious unsteady gait  Cervical dystonia:  He has significant right turn, moderate left tilt, mild retrocollis  ASSESSMENT AND PLAN  Chad Kelley is a 64 y.o. male   Autosomal dominant Charcot-Marie-Tooth disease, genetically confirmed GJB1 mutation,  Progressive worsening bilateral upper and lower extremity sensory loss, weakness, gait abnormality, especially bilateral hand muscle weakness, difficult to make a tight grip, difficulty with finger abduction, adduction.  Cervical dystonia  Moderately severe right turn, moderate left tilt, mild retrocollis, hypertrophy of left lateral and posterior cervical muscles  Used BOTOX A 300 units.  Left sternocleidomastoid 25x3= 75 units Left posterior scales 25 units Left levator scapulae 25 units  Right splenius capitis 25 unitsx2= 50 units Right splenius cervix 25 units x4= 100 units Right levator scapular 25 units    Patient tolerated injection well, will return to clinic in 3 months for repeat injection, will change to BOTOX 100 unitsx2 for next injection  Marcial Pacas, M.D. Ph.D.  [facility] [address], [place], [place] [address] Ph: [phone] Fax: [phone]  CC: Horald Pollen, MD

## 2020-06-07 NOTE — Telephone Encounter (Signed)
Please change to BOTOX A 100 units x2 for next injection in Oct 2021 (change from 300 units)

## 2020-06-07 NOTE — Telephone Encounter (Addendum)
Noted.Will do

## 2020-06-21 DIAGNOSIS — Z0271 Encounter for disability determination: Secondary | ICD-10-CM

## 2020-08-10 DIAGNOSIS — N401 Enlarged prostate with lower urinary tract symptoms: Secondary | ICD-10-CM | POA: Insufficient documentation

## 2020-08-10 DIAGNOSIS — Z87898 Personal history of other specified conditions: Secondary | ICD-10-CM | POA: Insufficient documentation

## 2020-08-31 ENCOUNTER — Telehealth: Payer: Self-pay | Admitting: Neurology

## 2020-08-31 DIAGNOSIS — G600 Hereditary motor and sensory neuropathy: Secondary | ICD-10-CM

## 2020-08-31 DIAGNOSIS — R269 Unspecified abnormalities of gait and mobility: Secondary | ICD-10-CM

## 2020-08-31 NOTE — Telephone Encounter (Signed)
States he discussed bilateral AFO braces with Dr.Yan at his last visit. He would like a prescription sent to [facility].

## 2020-08-31 NOTE — Telephone Encounter (Signed)
Pt called, need prescription for braces for my legs. Would like a call from the nurse.

## 2020-09-01 DIAGNOSIS — R269 Unspecified abnormalities of gait and mobility: Secondary | ICD-10-CM | POA: Insufficient documentation

## 2020-09-01 NOTE — Telephone Encounter (Signed)
Orders Placed This Encounter  Procedures   PT orthosis to lower extremity      

## 2020-09-04 NOTE — Telephone Encounter (Addendum)
Prescription signed by MD, faxed and confirmed to [facility]. (ph: [phone], fax: [phone])

## 2020-09-13 ENCOUNTER — Ambulatory Visit: Payer: 59 | Admitting: Neurology

## 2020-10-03 ENCOUNTER — Other Ambulatory Visit: Payer: Self-pay

## 2020-10-03 ENCOUNTER — Encounter: Payer: Self-pay | Admitting: Neurology

## 2020-10-03 ENCOUNTER — Ambulatory Visit (INDEPENDENT_AMBULATORY_CARE_PROVIDER_SITE_OTHER): Payer: 59 | Admitting: Neurology

## 2020-10-03 VITALS — BP 137/79 | HR 72 | Ht 67.0 in | Wt 166.5 lb

## 2020-10-03 DIAGNOSIS — G243 Spasmodic torticollis: Secondary | ICD-10-CM | POA: Diagnosis not present

## 2020-10-03 MED ORDER — ONABOTULINUMTOXINA 100 UNITS IJ SOLR
200.0000 [IU] | Freq: Once | INTRAMUSCULAR | Status: AC
Start: 2020-10-03 — End: 2020-10-03
  Administered 2020-10-03: 200 [IU] via INTRAMUSCULAR

## 2020-10-03 NOTE — Progress Notes (Signed)
PATIENT: Chad Kelley DOB: Oct 08, 1956  Chief Complaint  Patient presents with  . Cervical Dystonia    Botox     HISTORICAL  CORNEL WERBER is a 64 year old male, seen in request by his primary care physician Dr. Mitchel Honour, [facility] for evaluation of cervical dystonia, wants to continue Botox injection through our office, initial evaluation was on January 04, 2019.  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, his mother, daughter, maternal cousin suffered the CMT disease  EMG nerve conduction study in February 2015: Right ulnar motor revealed prolonged latency with decreased amplitude and decreased conduction velocity within the demyelinating range, ulnar sensory nerve was absent, right radial sensory demonstrated prolonged latency decreased amplitude and decreased conduction velocity in the demyelinating range, radial motor also demonstrated prolonged latency conduction velocity within the demyelinating range, 26 m/s  In 1990s, he noticed gradual onset forceful head turning towards the right side, initially he noticed that while driving, gradually getting worse, was seen by [facility] Dr. Maudry Mayhew, began to receive EMG guided Botox injection since 2012, every 3 to 4 months, most documented injection was on March 02, 2018, I attempted to review previous MRI cervical spine CD in 2009 without success, there was no report available.  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   Injection pattern from [facility] on March 02 2018. Used BOTOX A 225 units   SCM Left 50 UNITS   Splenius Capitus 25  Levator Scapulae 25  Trap 40 DISPERSED IN AREAS OF PAIN HE SAID THIS HELPED A LOT LAST TIME.    SCM right NONE  Splenius Capitus 75  Levator Scapulae NONE  Trap 10   UPDATE Jan 21 2019: 1st EMG guided BOTOX A injection, used 300 units    UPDATE July 6th 2020: He responded very well to previous Botox injection on January 21, 2019, we used Botox a 300 units, today he complains of straining at left cervical region  UPDATE Sep 06 2019: He responded well to previous Botox injection in July 2020.  UPDATE Nov 30 2019: He responded very well to previous injection, there was no significant side effect noted.  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    REVIEW OF SYSTEMS: Full 14 system review of systems performed and notable only for  As above All other review of systems were negative.  ALLERGIES: No Known Allergies  HOME MEDICATIONS: Current Outpatient Medications  Medication Sig Dispense Refill  . acetic acid-hydrocortisone (VOSOL-HC) OTIC solution Place 3 drops into the  left ear 3 (three) times daily. 10 mL 1  . escitalopram (LEXAPRO) 10 MG tablet Take 1 tablet (10 mg total) by mouth daily. 30 tablet 5  . fluticasone (FLONASE) 50 MCG/ACT nasal spray 1 spray each nostril twice a day 18.2 mL 10  . OnabotulinumtoxinA (BOTOX  IM) Inject 200 Units into the muscle every 3 (three) months.      No current facility-administered medications for this visit.    PAST MEDICAL HISTORY: Past Medical History:  Diagnosis Date  . Allergy   . Charcot-Marie-Tooth disease   . Neuromuscular disorder ([place])   . Pancreatitis     PAST SURGICAL HISTORY: Past Surgical History:  Procedure Laterality Date  . HERNIA MESH REMOVAL    . HERNIA REPAIR    . pancreatitis    . VASECTOMY      FAMILY HISTORY: Family History  Problem Relation Age of Onset  . Diabetes Mother   . Hypertension Father   . Cancer Father   . Colon cancer Neg Hx   . Esophageal cancer Neg Hx   . Pancreatic cancer Neg Hx   . Rectal cancer Neg Hx   . Stomach cancer Neg Hx     SOCIAL HISTORY: Social History   Socioeconomic History  . Marital status: Single    Spouse name: Not on file  . Number of children: 1  . Years of education: college  . Highest education level: Not on file  Occupational History  . Occupation: Hair Dresser  Tobacco Use  . Smoking status: Current Some Day Smoker    Types: Cigars  . Smokeless tobacco: Never Used  . Tobacco comment: ocasionally smoke a cigar  Vaping Use  . Vaping Use: Never used  Substance and Sexual Activity  . Alcohol use: No  . Drug use: No  . Sexual activity: Yes  Other Topics Concern  . Not on file  Social History Narrative   Lives at home with his wife.   Right-handed.   Caffeine use:  Coffee and diet drinks throughout the day.   Social Determinants of Health   Financial Resource Strain:   . Difficulty of Paying Living Expenses: Not on file  Food Insecurity:   . Worried About Charity fundraiser in the Last Year: Not on file  . Ran Out of Food in the Last Year: Not on file  Transportation Needs:   . Lack of Transportation (Medical): Not on file  . Lack of Transportation (Non-Medical): Not on file  Physical Activity:   . Days of Exercise per Week: Not on file  . Minutes of Exercise per  Session: Not on file  Stress:   . Feeling of Stress : Not on file  Social Connections:   . Frequency of Communication with Friends and Family: Not on file  . Frequency of Social Gatherings with Friends and Family: Not on file  . Attends Religious Services: Not on file  . Active Member of Clubs or Organizations: Not on file  . Attends Archivist Meetings: Not on file  . Marital Status: Not on file  Intimate Partner Violence:   . Fear of Current or Ex-Partner: Not on file  . Emotionally Abused: Not on file  . Physically Abused: Not on file  . Sexually Abused: Not on file     PHYSICAL EXAM   Vitals:   10/03/20 1258  Height: 5\' 7"  (1.702 m)   Not recorded     Body mass index is 25.22 kg/m.  PHYSICAL  EXAMNIATION:  PHYSICAL EXAMNIATION:    Significant atrophy of bilateral intrinsic hand muscles, mild bilateral wrist flexion weakness, performed finger abduction, adduction weakness, mild bilateral hands grip weakness.  Atrophy of distal leg muscles below knee, wear rigid bilateral AFO, also has mild bilateral hip flexion weakness.     Cervical dystonia:  He has significant right turn, mild retrocollis (he feels that his neck was pushed towards the right side, pulling downwards towards his right shoulder)  ASSESSMENT AND PLAN  TORREZ RENFROE is a 64 y.o. male   Autosomal dominant Charcot-Marie-Tooth disease, genetically confirmed GJB1 mutation,  Progressive worsening bilateral upper and lower extremity sensory loss, weakness, gait abnormality, especially bilateral hand muscle weakness, difficult to make a tight grip, difficulty with finger abduction, adduction.  Cervical dystonia  Moderately  right turn, mild left tilt, mild retrocollis,   Used BOTOX A 200 units.  Left sternocleidomastoid 25x2= 50 units  Right splenius capitis 25 unitsx2= 50 units Right splenius cervix 25 units x 3= 75 units Right longissimus capitis 25 units  Patient tolerated injection  well, will return to clinic in 3 months for repeat injection   Marcial Pacas, M.D. Ph.D.  [facility] [address], [place], [place] [address] Ph: [phone] Fax: [phone]  CC: Horald Pollen, MD

## 2020-10-03 NOTE — Progress Notes (Signed)
**  Botox 100 units x 2 vial, NDC 9753-0051-10, Lot [id number], Exp 01/2023, office supply.//mck,rn**

## 2020-11-06 ENCOUNTER — Encounter: Payer: Self-pay | Admitting: Emergency Medicine

## 2020-11-06 ENCOUNTER — Other Ambulatory Visit: Payer: Self-pay

## 2020-11-06 ENCOUNTER — Ambulatory Visit (INDEPENDENT_AMBULATORY_CARE_PROVIDER_SITE_OTHER): Payer: 59 | Admitting: Emergency Medicine

## 2020-11-06 VITALS — BP 131/82 | HR 79 | Temp 97.6°F | Resp 16 | Ht 66.0 in | Wt 166.0 lb

## 2020-11-06 DIAGNOSIS — Z0001 Encounter for general adult medical examination with abnormal findings: Secondary | ICD-10-CM | POA: Diagnosis not present

## 2020-11-06 DIAGNOSIS — G709 Myoneural disorder, unspecified: Secondary | ICD-10-CM | POA: Diagnosis not present

## 2020-11-06 DIAGNOSIS — Z130 Encounter for screening for diseases of the blood and blood-forming organs and certain disorders involving the immune mechanism: Secondary | ICD-10-CM

## 2020-11-06 DIAGNOSIS — Z23 Encounter for immunization: Secondary | ICD-10-CM | POA: Diagnosis not present

## 2020-11-06 DIAGNOSIS — G600 Hereditary motor and sensory neuropathy: Secondary | ICD-10-CM | POA: Diagnosis not present

## 2020-11-06 DIAGNOSIS — Z13228 Encounter for screening for other metabolic disorders: Secondary | ICD-10-CM

## 2020-11-06 DIAGNOSIS — Z1329 Encounter for screening for other suspected endocrine disorder: Secondary | ICD-10-CM

## 2020-11-06 DIAGNOSIS — Z13220 Encounter for screening for lipoid disorders: Secondary | ICD-10-CM

## 2020-11-06 NOTE — Patient Instructions (Addendum)
   If you have lab work done today you will be contacted with your lab results within the next 2 weeks.  If you have not heard from us then please contact us. The fastest way to get your results is to register for My Chart.   IF you received an x-ray today, you will receive an invoice from [facility]. Please contact [facility] at [phone] with questions or concerns regarding your invoice.   IF you received labwork today, you will receive an invoice from [facility]. Please contact [facility] at [phone] with questions or concerns regarding your invoice.   Our billing staff will not be able to assist you with questions regarding bills from these companies.  You will be contacted with the lab results as soon as they are available. The fastest way to get your results is to activate your My Chart account. Instructions are located on the last page of this paperwork. If you have not heard from us regarding the results in 2 weeks, please contact this office.      Health Maintenance, Male Adopting a healthy lifestyle and getting preventive care are important in promoting health and wellness. Ask your health care provider about:  The right schedule for you to have regular tests and exams.  Things you can do on your own to prevent diseases and keep yourself healthy. What should I know about diet, weight, and exercise? Eat a healthy diet   Eat a diet that includes plenty of vegetables, fruits, low-fat dairy products, and lean protein.  Do not eat a lot of foods that are high in solid fats, added sugars, or sodium. Maintain a healthy weight Body mass index (BMI) is a measurement that can be used to identify possible weight problems. It estimates body fat based on height and weight. Your health care provider can help determine your BMI and help you achieve or maintain a healthy weight. Get regular exercise Get regular exercise. This is one of the most important things you  can do for your health. Most adults should:  Exercise for at least 150 minutes each week. The exercise should increase your heart rate and make you sweat (moderate-intensity exercise).  Do strengthening exercises at least twice a week. This is in addition to the moderate-intensity exercise.  Spend less time sitting. Even light physical activity can be beneficial. Watch cholesterol and blood lipids Have your blood tested for lipids and cholesterol at 63 years of age, then have this test every 5 years. You may need to have your cholesterol levels checked more often if:  Your lipid or cholesterol levels are high.  You are older than 63 years of age.  You are at high risk for heart disease. What should I know about cancer screening? Many types of cancers can be detected early and may often be prevented. Depending on your health history and family history, you may need to have cancer screening at various ages. This may include screening for:  Colorectal cancer.  Prostate cancer.  Skin cancer.  Lung cancer. What should I know about heart disease, diabetes, and high blood pressure? Blood pressure and heart disease  High blood pressure causes heart disease and increases the risk of stroke. This is more likely to develop in people who have high blood pressure readings, are of African descent, or are overweight.  Talk with your health care provider about your target blood pressure readings.  Have your blood pressure checked: ? Every 3-5 years if you are 18-39   years of age. ? Every year if you are 40 years old or older.  If you are between the ages of 65 and 75 and are a current or former smoker, ask your health care provider if you should have a one-time screening for abdominal aortic aneurysm (AAA). Diabetes Have regular diabetes screenings. This checks your fasting blood sugar level. Have the screening done:  Once every three years after age 45 if you are at a normal weight and have  a low risk for diabetes.  More often and at a younger age if you are overweight or have a high risk for diabetes. What should I know about preventing infection? Hepatitis B If you have a higher risk for hepatitis B, you should be screened for this virus. Talk with your health care provider to find out if you are at risk for hepatitis B infection. Hepatitis C Blood testing is recommended for:  Everyone born from 1945 through 1965.  Anyone with known risk factors for hepatitis C. Sexually transmitted infections (STIs)  You should be screened each year for STIs, including gonorrhea and chlamydia, if: ? You are sexually active and are younger than 63 years of age. ? You are older than 63 years of age and your health care provider tells you that you are at risk for this type of infection. ? Your sexual activity has changed since you were last screened, and you are at increased risk for chlamydia or gonorrhea. Ask your health care provider if you are at risk.  Ask your health care provider about whether you are at high risk for HIV. Your health care provider may recommend a prescription medicine to help prevent HIV infection. If you choose to take medicine to prevent HIV, you should first get tested for HIV. You should then be tested every 3 months for as long as you are taking the medicine. Follow these instructions at home: Lifestyle  Do not use any products that contain nicotine or tobacco, such as cigarettes, e-cigarettes, and chewing tobacco. If you need help quitting, ask your health care provider.  Do not use street drugs.  Do not share needles.  Ask your health care provider for help if you need support or information about quitting drugs. Alcohol use  Do not drink alcohol if your health care provider tells you not to drink.  If you drink alcohol: ? Limit how much you have to 0-2 drinks a day. ? Be aware of how much alcohol is in your drink. In the [place] one drink equals one 12  oz bottle of beer (355 mL), one 5 oz glass of wine (148 mL), or one 1 oz glass of hard liquor (44 mL). General instructions  Schedule regular health, dental, and eye exams.  Stay current with your vaccines.  Tell your health care provider if: ? You often feel depressed. ? You have ever been abused or do not feel safe at home. Summary  Adopting a healthy lifestyle and getting preventive care are important in promoting health and wellness.  Follow your health care provider's instructions about healthy diet, exercising, and getting tested or screened for diseases.  Follow your health care provider's instructions on monitoring your cholesterol and blood pressure. This information is not intended to replace advice given to you by your health care provider. Make sure you discuss any questions you have with your health care provider. Document Revised: 11/11/2018 Document Reviewed: 11/11/2018 Elsevier Patient Education  2020 [employer]  

## 2020-11-06 NOTE — Progress Notes (Signed)
Chad Kelley 64 y.o.   Chief Complaint  Patient presents with  . Annual Exam    HISTORY OF PRESENT ILLNESS: This is a 64 y.o. male here for annual exam. Has history of Charcot-Marie-Tooth disease. Recently seen in the hospital for acute urinary retention.  Foley catheter successfully placed and referred to urologist.  Uncertain etiology of urinary retention.  Started on Flomax 0.4 mg daily with good results. No other complaints or medical concerns today.  HPI   Prior to Admission medications   Medication Sig Start Date End Date Taking? Authorizing Provider  acetic acid-hydrocortisone (VOSOL-HC) OTIC solution Place 3 drops into the left ear 3 (three) times daily. 10/26/18  Yes Chad Kelley, Chad Bloomer, MD  escitalopram (LEXAPRO) 10 MG tablet Take 1 tablet (10 mg total) by mouth daily. 10/26/18  Yes Chad Pollen, MD  fluticasone [facility]) 50 MCG/ACT nasal spray 1 spray each nostril twice a day 11/01/19  Yes Chad Kelley, Chad Bloomer, MD  OnabotulinumtoxinA (BOTOX IM) Inject 200 Units into the muscle every 3 (three) months.    Yes [provider]    Not on File  Patient Active Problem List   Diagnosis Date Noted  . Gait abnormality 09/01/2020  . Cervical dystonia 01/04/2019  . CMT (Charcot-Marie-Tooth disease) 01/04/2019  . History of elevated PSA 10/26/2018  . Charcot-Marie-Tooth disease 10/26/2018    Past Medical History:  Diagnosis Date  . Allergy   . Charcot-Marie-Tooth disease   . Neuromuscular disorder (Chad Kelley)   . Pancreatitis     Past Surgical History:  Procedure Laterality Date  . HERNIA MESH REMOVAL    . HERNIA REPAIR    . pancreatitis    . VASECTOMY      Social History   Socioeconomic History  . Marital status: Single    Spouse name: Not on file  . Number of children: 1  . Years of education: college  . Highest education level: Not on file  Occupational History  . Occupation: Hair Dresser  Tobacco Use  . Smoking status: Current Some Day  Smoker    Types: Cigars  . Smokeless tobacco: Never Used  . Tobacco comment: ocasionally smoke a cigar  Vaping Use  . Vaping Use: Never used  Substance and Sexual Activity  . Alcohol use: No  . Drug use: No  . Sexual activity: Yes  Other Topics Concern  . Not on file  Social History Narrative   Lives at home with his wife.   Right-handed.   Caffeine use:  Coffee and diet drinks throughout the day.   Social Determinants of Health   Financial Resource Strain:   . Difficulty of Paying Living Expenses: Not on file  Food Insecurity:   . Worried About Charity fundraiser in the Last Year: Not on file  . Ran Out of Food in the Last Year: Not on file  Transportation Needs:   . Lack of Transportation (Medical): Not on file  . Lack of Transportation (Non-Medical): Not on file  Physical Activity:   . Days of Exercise per Week: Not on file  . Minutes of Exercise per Session: Not on file  Stress:   . Feeling of Stress : Not on file  Social Connections:   . Frequency of Communication with Friends and Family: Not on file  . Frequency of Social Gatherings with Friends and Family: Not on file  . Attends Religious Services: Not on file  . Active Member of Clubs or Organizations: Not on file  .  Attends Archivist Meetings: Not on file  . Marital Status: Not on file  Intimate Partner Violence:   . Fear of Current or Ex-Partner: Not on file  . Emotionally Abused: Not on file  . Physically Abused: Not on file  . Sexually Abused: Not on file    Family History  Problem Relation Age of Onset  . Diabetes Mother   . Hypertension Father   . Cancer Father   . Colon cancer Neg Hx   . Esophageal cancer Neg Hx   . Pancreatic cancer Neg Hx   . Rectal cancer Neg Hx   . Stomach cancer Neg Hx      Review of Systems  Constitutional: Negative.  Negative for chills and fever.  HENT: Negative.  Negative for congestion and sore throat.   Respiratory: Negative.  Negative for cough and  shortness of breath.   Cardiovascular: Negative.  Negative for chest pain and palpitations.  Gastrointestinal: Negative.  Negative for abdominal pain, diarrhea, nausea and vomiting.  Genitourinary: Negative.  Negative for dysuria and hematuria.  Skin: Negative.  Negative for rash.  Neurological: Negative.  Negative for dizziness and headaches.  All other systems reviewed and are negative.  Today's Vitals   11/06/20 1615  BP: 131/82  Pulse: 79  Resp: 16  Temp: 97.6 F (36.4 C)  TempSrc: Temporal  SpO2: 96%  Weight: 166 lb (75.3 kg)  Height: 5\' 6"  (1.676 m)   Body mass index is 26.79 kg/m.   Physical Exam Vitals reviewed.  Constitutional:      Appearance: Normal appearance.  HENT:     Head: Normocephalic.  Eyes:     Extraocular Movements: Extraocular movements intact.     Pupils: Pupils are equal, round, and reactive to light.  Cardiovascular:     Rate and Rhythm: Normal rate and regular rhythm.     Pulses: Normal pulses.     Heart sounds: Normal heart sounds.  Pulmonary:     Effort: Pulmonary effort is normal.     Breath sounds: Normal breath sounds.  Musculoskeletal:     Cervical back: Normal range of motion and neck supple.  Skin:    General: Skin is warm and dry.  Neurological:     General: No focal deficit present.     Mental Status: He is alert and oriented to person, place, and time.  Psychiatric:        Mood and Affect: Mood normal.        Behavior: Behavior normal.      ASSESSMENT & PLAN: Chad Kelley was seen today for annual exam.  Diagnoses and all orders for this visit:  Encounter for general adult medical examination with abnormal findings  Charcot-Marie-Tooth disease  Neuromuscular disorder (Chad Kelley)  Need for prophylactic vaccination and inoculation against influenza -     Flu Vaccine QUAD 36+ mos IM  Screening for deficiency anemia -     CBC with Differential/Platelet  Screening for lipoid disorders -     Lipid panel  Screening for  endocrine, metabolic and immunity disorder -     Comprehensive metabolic panel -     Hemoglobin A1c    Patient Instructions       If you have lab work done today you will be contacted with your lab results within the next 2 weeks.  If you have not heard from [place] then please contact us. The fastest way to get your results is to register for My Chart.   IF you received  an x-ray today, you will receive an invoice from [facility]. Please contact [facility] at [phone] with questions or concerns regarding your invoice.   IF you received labwork today, you will receive an invoice from [facility]. Please contact [facility] at [phone] with questions or concerns regarding your invoice.   Our billing staff will not be able to assist you with questions regarding bills from these companies.  You will be contacted with the lab results as soon as they are available. The fastest way to get your results is to activate your My Chart account. Instructions are located on the last page of this paperwork. If you have not heard from [place] regarding the results in 2 weeks, please contact this office.      Health Maintenance, Male Adopting a healthy lifestyle and getting preventive care are important in promoting health and wellness. Ask your health care provider about:  The right schedule for you to have regular tests and exams.  Things you can do on your own to prevent diseases and keep yourself healthy. What should I know about diet, weight, and exercise? Eat a healthy diet   Eat a diet that includes plenty of vegetables, fruits, low-fat dairy products, and lean protein.  Do not eat a lot of foods that are high in solid fats, added sugars, or sodium. Maintain a healthy weight Body mass index (BMI) is a measurement that can be used to identify possible weight problems. It estimates body fat based on height and weight. Your health care provider can help determine your BMI and  help you achieve or maintain a healthy weight. Get regular exercise Get regular exercise. This is one of the most important things you can do for your health. Most adults should:  Exercise for at least 150 minutes each week. The exercise should increase your heart rate and make you sweat (moderate-intensity exercise).  Do strengthening exercises at least twice a week. This is in addition to the moderate-intensity exercise.  Spend less time sitting. Even light physical activity can be beneficial. Watch cholesterol and blood lipids Have your blood tested for lipids and cholesterol at 63 years of age, then have this test every 5 years. You may need to have your cholesterol levels checked more often if:  Your lipid or cholesterol levels are high.  You are older than 63 years of age.  You are at high risk for heart disease. What should I know about cancer screening? Many types of cancers can be detected early and may often be prevented. Depending on your health history and family history, you may need to have cancer screening at various ages. This may include screening for:  Colorectal cancer.  Prostate cancer.  Skin cancer.  Lung cancer. What should I know about heart disease, diabetes, and high blood pressure? Blood pressure and heart disease  High blood pressure causes heart disease and increases the risk of stroke. This is more likely to develop in people who have high blood pressure readings, are of African descent, or are overweight.  Talk with your health care provider about your target blood pressure readings.  Have your blood pressure checked: ? Every 3-5 years if you are 2-5 years of age. ? Every year if you are 41 years old or older.  If you are between the ages of 59 and 60 and are a current or former smoker, ask your health care provider if you should have a one-time screening for abdominal aortic aneurysm (AAA). Diabetes Have regular diabetes  screenings. This  checks your fasting blood sugar level. Have the screening done:  Once every three years after age 17 if you are at a normal weight and have a low risk for diabetes.  More often and at a younger age if you are overweight or have a high risk for diabetes. What should I know about preventing infection? Hepatitis B If you have a higher risk for hepatitis B, you should be screened for this virus. Talk with your health care provider to find out if you are at risk for hepatitis B infection. Hepatitis C Blood testing is recommended for:  Everyone born from [birth year] through 1964.  Anyone with known risk factors for hepatitis C. Sexually transmitted infections (STIs)  You should be screened each year for STIs, including gonorrhea and chlamydia, if: ? You are sexually active and are younger than 63 years of age. ? You are older than 63 years of age and your health care provider tells you that you are at risk for this type of infection. ? Your sexual activity has changed since you were last screened, and you are at increased risk for chlamydia or gonorrhea. Ask your health care provider if you are at risk.  Ask your health care provider about whether you are at high risk for HIV. Your health care provider may recommend a prescription medicine to help prevent HIV infection. If you choose to take medicine to prevent HIV, you should first get tested for HIV. You should then be tested every 3 months for as long as you are taking the medicine. Follow these instructions at home: Lifestyle  Do not use any products that contain nicotine or tobacco, such as cigarettes, e-cigarettes, and chewing tobacco. If you need help quitting, ask your health care provider.  Do not use street drugs.  Do not share needles.  Ask your health care provider for help if you need support or information about quitting drugs. Alcohol use  Do not drink alcohol if your health care provider tells you not to drink.  If you drink  alcohol: ? Limit how much you have to 0-2 drinks a day. ? Be aware of how much alcohol is in your drink. In the [place] one drink equals one 12 oz bottle of beer (355 mL), one 5 oz glass of wine (148 mL), or one 1 oz glass of hard liquor (44 mL). General instructions  Schedule regular health, dental, and eye exams.  Stay current with your vaccines.  Tell your health care provider if: ? You often feel depressed. ? You have ever been abused or do not feel safe at home. Summary  Adopting a healthy lifestyle and getting preventive care are important in promoting health and wellness.  Follow your health care provider's instructions about healthy diet, exercising, and getting tested or screened for diseases.  Follow your health care provider's instructions on monitoring your cholesterol and blood pressure. This information is not intended to replace advice given to you by your health care provider. Make sure you discuss any questions you have with your health care provider. Document Revised: 11/11/2018 Document Reviewed: 11/11/2018 Elsevier Patient Education  2020 Elsevier Inc.      Agustina Caroli, MD [facility]

## 2020-11-07 LAB — COMPREHENSIVE METABOLIC PANEL
ALT: 23 IU/L (ref 0–44)
AST: 18 IU/L (ref 0–40)
Albumin/Globulin Ratio: 1.5 (ref 1.2–2.2)
Albumin: 4.5 g/dL (ref 3.8–4.8)
Alkaline Phosphatase: 63 IU/L (ref 44–121)
BUN/Creatinine Ratio: 31 — ABNORMAL HIGH (ref 10–24)
BUN: 23 mg/dL (ref 8–27)
Bilirubin Total: 0.3 mg/dL (ref 0.0–1.2)
CO2: 23 mmol/L (ref 20–29)
Calcium: 9.5 mg/dL (ref 8.6–10.2)
Chloride: 102 mmol/L (ref 96–106)
Creatinine, Ser: 0.74 mg/dL — ABNORMAL LOW (ref 0.76–1.27)
GFR calc Af Amer: 113 mL/min/{1.73_m2} (ref >59)
GFR calc non Af Amer: 98 mL/min/{1.73_m2} (ref >59)
Globulin, Total: 3 g/dL (ref 1.5–4.5)
Glucose: 84 mg/dL (ref 65–99)
Potassium: 4.1 mmol/L (ref 3.5–5.2)
Sodium: 139 mmol/L (ref 134–144)
Total Protein: 7.5 g/dL (ref 6.0–8.5)

## 2020-11-07 LAB — CBC WITH DIFFERENTIAL/PLATELET
Basophils Absolute: 0.1 10*3/uL (ref 0.0–0.2)
Basos: 1 %
EOS (ABSOLUTE): 0.2 10*3/uL (ref 0.0–0.4)
Eos: 3 %
Hematocrit: 48.6 % (ref 37.5–51.0)
Hemoglobin: 17.5 g/dL (ref 13.0–17.7)
Immature Grans (Abs): 0 10*3/uL (ref 0.0–0.1)
Immature Granulocytes: 0 %
Lymphocytes Absolute: 1.7 10*3/uL (ref 0.7–3.1)
Lymphs: 27 %
MCH: 33.1 pg — ABNORMAL HIGH (ref 26.6–33.0)
MCHC: 36 g/dL — ABNORMAL HIGH (ref 31.5–35.7)
MCV: 92 fL (ref 79–97)
Monocytes Absolute: 0.6 10*3/uL (ref 0.1–0.9)
Monocytes: 9 %
Neutrophils Absolute: 3.7 10*3/uL (ref 1.4–7.0)
Neutrophils: 60 %
Platelets: 227 10*3/uL (ref 150–450)
RBC: 5.28 x10E6/uL (ref 4.14–5.80)
RDW: 12.7 % (ref 11.6–15.4)
WBC: 6.3 10*3/uL (ref 3.4–10.8)

## 2020-11-07 LAB — HEMOGLOBIN A1C
Est. average glucose Bld gHb Est-mCnc: 103 mg/dL
Hgb A1c MFr Bld: 5.2 % (ref 4.8–5.6)

## 2020-11-07 LAB — LIPID PANEL
Chol/HDL Ratio: 4.4 ratio (ref 0.0–5.0)
Cholesterol, Total: 193 mg/dL (ref 100–199)
HDL: 44 mg/dL (ref >39)
LDL Chol Calc (NIH): 117 mg/dL — ABNORMAL HIGH (ref 0–99)
Triglycerides: 182 mg/dL — ABNORMAL HIGH (ref 0–149)
VLDL Cholesterol Cal: 32 mg/dL (ref 5–40)

## 2021-01-10 ENCOUNTER — Encounter: Payer: Self-pay | Admitting: Neurology

## 2021-01-10 ENCOUNTER — Ambulatory Visit (INDEPENDENT_AMBULATORY_CARE_PROVIDER_SITE_OTHER): Payer: 59 | Admitting: Neurology

## 2021-01-10 ENCOUNTER — Other Ambulatory Visit: Payer: Self-pay

## 2021-01-10 VITALS — BP 125/75 | HR 83 | Ht 66.0 in | Wt 167.5 lb

## 2021-01-10 DIAGNOSIS — G243 Spasmodic torticollis: Secondary | ICD-10-CM | POA: Diagnosis not present

## 2021-01-10 MED ORDER — ONABOTULINUMTOXINA 100 UNITS IJ SOLR
200.0000 [IU] | Freq: Once | INTRAMUSCULAR | Status: AC
  Administered 2021-01-10: 200 [IU] via INTRAMUSCULAR

## 2021-01-10 NOTE — Progress Notes (Signed)
**  Botox 100 units x 2 vials, [id number], Lot [id number], Exp 01/2023, office supply.//mck,rn**

## 2021-01-10 NOTE — Progress Notes (Signed)
PATIENT: Chad Kelley DOB: [birth year]  Chief Complaint  Patient presents with  . Procedure    Cervical Dystonia - Botox     HISTORICAL  Chad Kelley is a 65 year old male, seen in request by his primary care physician Dr. Mitchel Honour, [facility] for evaluation of cervical dystonia, wants to continue Botox injection through our office, initial evaluation was on January 04, 2019.  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, his mother, daughter, maternal cousin suffered the CMT disease  EMG nerve conduction study in February 2015: Right ulnar motor revealed prolonged latency with decreased amplitude and decreased conduction velocity within the demyelinating range, ulnar sensory nerve was absent, right radial sensory demonstrated prolonged latency decreased amplitude and decreased conduction velocity in the demyelinating range, radial motor also demonstrated prolonged latency conduction velocity within the demyelinating range, 26 m/s  In 1990s, he noticed gradual onset forceful head turning towards the right side, initially he noticed that while driving, gradually getting worse, was seen by [facility] Dr. Maudry Mayhew, began to receive EMG guided Botox injection since 2012, every 3 to 4 months, most documented injection was on March 02, 2018, I attempted to review previous MRI cervical spine CD in 2009 without success, there was no report available.  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   Injection pattern from [facility] on March 02 2018. Used BOTOX A 225 units   SCM Left 50 UNITS   Splenius Capitus 25  Levator Scapulae 25  Trap 40 DISPERSED IN AREAS OF PAIN HE SAID THIS HELPED A LOT LAST TIME.    SCM right NONE  Splenius Capitus 75  Levator Scapulae NONE  Trap 10   UPDATE Jan 21 2019: 1st EMG guided BOTOX A injection, used 300 units    UPDATE July 6th 2020: He responded very well to previous Botox injection on January 21, 2019, we used Botox a 300 units, today he complains of straining at left cervical region  UPDATE Sep 06 2019: He responded well to previous Botox injection in July 2020.  UPDATE Nov 30 2019: He responded very well to previous injection, there was no significant side effect noted.  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  Update January 10, 2021: He did well with Botox 200 units, but complains of pain at right upper neck pain injection site close to right nuchal line  REVIEW OF SYSTEMS: Full 14 system review of systems performed and notable only for  As above All other review of systems were negative.  ALLERGIES: Not on File  HOME MEDICATIONS: Current Outpatient Medications  Medication Sig Dispense Refill  . acetic acid-hydrocortisone (VOSOL-HC) OTIC solution Place 3 drops into the left ear 3 (three) times daily. 10 mL 1  . escitalopram (LEXAPRO) 10 MG tablet Take 1 tablet (10 mg total)  by mouth daily. 30 tablet 5  . fluticasone (FLONASE) 50 MCG/ACT nasal spray 1 spray each nostril twice a day 18.2 mL 10  . OnabotulinumtoxinA (BOTOX IM) Inject 200 Units into the muscle every 3 (three) months.     . tamsulosin (FLOMAX) 0.4 MG CAPS capsule Take 0.4 mg by mouth.     No current facility-administered medications for this visit.    PAST MEDICAL HISTORY: Past Medical History:  Diagnosis Date  . Allergy   . Charcot-Marie-Tooth disease   . Neuromuscular disorder ([place])   . Pancreatitis     PAST SURGICAL HISTORY: Past Surgical History:  Procedure Laterality Date  . HERNIA MESH REMOVAL    . HERNIA REPAIR    . pancreatitis    . VASECTOMY      FAMILY HISTORY: Family History  Problem Relation Age of Onset  . Diabetes Mother   . Hypertension Father   . Cancer Father   . Colon cancer Neg Hx   . Esophageal cancer Neg Hx   . Pancreatic cancer Neg Hx   . Rectal cancer Neg Hx   . Stomach cancer Neg Hx     SOCIAL HISTORY: Social History   Socioeconomic History  . Marital status: Single    Spouse name: Not on file  . Number of children: 1  . Years of education: college  . Highest education level: Not on file  Occupational History  . Occupation: Hair Dresser  Tobacco Use  . Smoking status: Current Some Day Smoker    Types: Cigars  . Smokeless tobacco: Never Used  . Tobacco comment: ocasionally smoke a cigar  Vaping Use  . Vaping Use: Never used  Substance and Sexual Activity  . Alcohol use: No  . Drug use: No  . Sexual activity: Yes  Other Topics Concern  . Not on file  Social History Narrative   Lives at home with his wife.   Right-handed.   Caffeine use:  Coffee and diet drinks throughout the day.   Social Determinants of Health   Financial Resource Strain: Not on file  Food Insecurity: Not on file  Transportation Needs: Not on file  Physical Activity: Not on file  Stress: Not on file  Social Connections: Not on file  Intimate Partner Violence:  Not on file     PHYSICAL EXAM   Vitals:   01/10/21 1321  BP: 125/75  Pulse: 83  Weight: 167 lb 8 oz (76 kg)  Height: 5\' 6"  (1.676 m)   Not recorded     Body mass index is 27.04 kg/m.  PHYSICAL EXAMNIATION:  PHYSICAL EXAMNIATION:    Significant atrophy of bilateral intrinsic hand muscles, mild bilateral wrist flexion weakness, performed finger abduction, adduction weakness, mild bilateral hands grip weakness.  Atrophy of distal leg muscles below knee, wear rigid bilateral AFO, also has mild bilateral hip flexion weakness.     Cervical dystonia:  He has significant right turn, mild retrocollis (he feels that his neck was pushed towards the right side, pulling downwards towards his right shoulder)  ASSESSMENT AND PLAN  GARALD RHEW is a 65 y.o. male   Autosomal dominant Charcot-Marie-Tooth disease, genetically confirmed GJB1 mutation,  Progressive worsening bilateral upper  and lower extremity sensory loss, weakness, gait abnormality, especially bilateral hand muscle weakness, difficult to make a tight grip, difficulty with finger abduction, adduction.  Cervical dystonia  Moderately  right turn, mild left tilt, mild retrocollis,   Used BOTOX A 200 units.  Left sternocleidomastoid 25x2= 50 units   Right splenius capitis 25 units  Right splenius cervix 25 units x 2= 50 units Right levator scapula 25 units Right longissimus capitis 25 units Right sternocleidomastoid 25 units  Patient tolerated injection well, will return to clinic in 3 months for repeat injection   Marcial Pacas, M.D. Ph.D.  [facility] [address] [place], [place] [address] Ph: [phone] Fax: [phone]  CC: Horald Pollen, MD

## 2021-01-23 ENCOUNTER — Telehealth: Payer: Self-pay | Admitting: Neurology

## 2021-01-23 NOTE — Telephone Encounter (Signed)
Patient's PA on file with [employer] for Botox expires 02/21/21. I filled out new PA and faxed to [facility] with office notes.

## 2021-01-24 NOTE — Telephone Encounter (Signed)
Received approval from [facility] via fax. Case #[id number] (02/22/21- 02/22/22).

## 2021-04-18 ENCOUNTER — Ambulatory Visit: Payer: Self-pay | Admitting: Neurology

## 2021-04-19 ENCOUNTER — Ambulatory Visit: Payer: Self-pay | Admitting: Neurology

## 2021-04-25 ENCOUNTER — Other Ambulatory Visit: Payer: Self-pay

## 2021-04-25 ENCOUNTER — Ambulatory Visit (INDEPENDENT_AMBULATORY_CARE_PROVIDER_SITE_OTHER): Payer: BC Managed Care – PPO | Admitting: Neurology

## 2021-04-25 ENCOUNTER — Encounter: Payer: Self-pay | Admitting: Neurology

## 2021-04-25 VITALS — BP 132/84 | HR 66 | Ht 66.0 in | Wt 166.5 lb

## 2021-04-25 DIAGNOSIS — G243 Spasmodic torticollis: Secondary | ICD-10-CM | POA: Diagnosis not present

## 2021-04-25 MED ORDER — ONABOTULINUMTOXINA 100 UNITS IJ SOLR
200.0000 [IU] | Freq: Once | INTRAMUSCULAR | Status: AC
  Administered 2021-04-25: 200 [IU] via INTRAMUSCULAR

## 2021-04-25 NOTE — Progress Notes (Signed)
PATIENT: Chad Kelley DOB: 10/05/1956  Chief Complaint  Patient presents with  . Cervical dystonia    New room, Botox injections     HISTORICAL  Chad Kelley is a 65 year old male, seen in request by his primary care physician Dr. Mitchel Honour, [facility] for evaluation of cervical dystonia, wants to continue Botox injection through our office, initial evaluation was on January 04, 2019.  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, his mother, daughter, maternal cousin suffered the CMT disease  EMG nerve conduction study in February 2015: Right ulnar motor revealed prolonged latency with decreased amplitude and decreased conduction velocity within the demyelinating range, ulnar sensory nerve was absent, right radial sensory demonstrated prolonged latency decreased amplitude and decreased conduction velocity in the demyelinating range, radial motor also demonstrated prolonged latency conduction velocity within the demyelinating range, 26 m/s  In 1990s, he noticed gradual onset forceful head turning towards the right side, initially he noticed that while driving, gradually getting worse, was seen by [facility] Dr. Maudry Mayhew, began to receive EMG guided Botox injection since 2012, every 3 to 4 months, most documented injection was on March 02, 2018, I attempted to review previous MRI cervical spine CD in 2009 without success, there was no report available.  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   Injection pattern from [facility] on March 02 2018. Used BOTOX A 225 units   SCM Left 50 UNITS   Splenius Capitus 25  Levator Scapulae 25  Trap 40 DISPERSED IN AREAS OF PAIN HE SAID THIS HELPED A LOT LAST TIME.    SCM right NONE  Splenius Capitus 75  Levator Scapulae NONE  Trap 10   UPDATE Jan 21 2019: 1st EMG guided BOTOX A injection, used 300 units    UPDATE July 6th 2020: He responded very well to previous Botox injection on January 21, 2019, we used Botox a 300 units, today he complains of straining at left cervical region  UPDATE Sep 06 2019: He responded well to previous Botox injection in July 2020.  UPDATE Nov 30 2019: He responded very well to previous injection, there was no significant side effect noted.  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  Update January 10, 2021: He did well with Botox 200 units, but complains of pain at right upper neck pain injection site close to right nuchal line  UPDATE May 25th 2022: He responded very well to previous injection, no significant side effect noted  REVIEW OF SYSTEMS: Full 14 system  review of systems performed and notable only for  As above All other review of systems were negative.  ALLERGIES: No Known Allergies  HOME MEDICATIONS: Current Outpatient Medications  Medication Sig Dispense Refill  . acetic acid-hydrocortisone (VOSOL-HC) OTIC solution Place  3 drops into the left ear 3 (three) times daily. 10 mL 1  . escitalopram (LEXAPRO) 10 MG tablet Take 1 tablet (10 mg total) by mouth daily. 30 tablet 5  . fluticasone (FLONASE) 50 MCG/ACT nasal spray 1 spray each nostril twice a day 18.2 mL 10  . OnabotulinumtoxinA (BOTOX IM) Inject 200 Units into the muscle every 3 (three) months.     . tamsulosin (FLOMAX) 0.4 MG CAPS capsule Take 0.4 mg by mouth.     No current facility-administered medications for this visit.    PAST MEDICAL HISTORY: Past Medical History:  Diagnosis Date  . Allergy   . Charcot-Marie-Tooth disease   . Neuromuscular disorder ([place])   . Pancreatitis     PAST SURGICAL HISTORY: Past Surgical History:  Procedure Laterality Date  . HERNIA MESH REMOVAL    . HERNIA REPAIR    . pancreatitis    . VASECTOMY      FAMILY HISTORY: Family History  Problem Relation Age of Onset  . Diabetes Mother   . Charcot-Marie-Tooth disease Mother   . Hypertension Father   . Cancer Father   . Colon cancer Neg Hx   . Esophageal cancer Neg Hx   . Pancreatic cancer Neg Hx   . Rectal cancer Neg Hx   . Stomach cancer Neg Hx     SOCIAL HISTORY: Social History   Socioeconomic History  . Marital status: Single    Spouse name: Not on file  . Number of children: 1  . Years of education: college  . Highest education level: Not on file  Occupational History  . Occupation: Hair Dresser  Tobacco Use  . Smoking status: Current Some Day Smoker    Types: Cigars  . Smokeless tobacco: Never Used  . Tobacco comment: ocasionally smoke a cigar  Vaping Use  . Vaping Use: Never used  Substance and Sexual Activity  . Alcohol use: No  . Drug use: No  . Sexual activity: Yes  Other Topics Concern  . Not on file  Social History Narrative   Lives at home with his wife.   Right-handed.   Caffeine use:  Coffee and diet drinks throughout the day.   Social Determinants of Health   Financial Resource Strain: Not on file  Food Insecurity:  Not on file  Transportation Needs: Not on file  Physical Activity: Not on file  Stress: Not on file  Social Connections: Not on file  Intimate Partner Violence: Not on file     PHYSICAL EXAM   Vitals:   04/25/21 1131  BP: 132/84  Pulse: 66  Weight: 166 lb 8 oz (75.5 kg)  Height: 5\' 6"  (1.676 m)   Not recorded     Body mass index is 26.87 kg/m.  PHYSICAL EXAMNIATION:  PHYSICAL EXAMNIATION:    Significant atrophy of bilateral intrinsic hand muscles, mild bilateral finger extension  weakness, profound finger abduction weakness, mild to moderate bilateral hands grip weakness.  Atrophy of distal leg muscles below knee, wear rigid bilateral AFO, also has mild bilateral hip flexion weakness.     Cervical dystonia:  He has significant right turn, moderate left tilt, (he feels that his neck was pushed towards the right side, pulling downwards towards his right shoulder)  ASSESSMENT AND PLAN  Chad Kelley is a 65 y.o. male   Autosomal dominant Charcot-Marie-Tooth disease, genetically confirmed GJB1 mutation,  Progressive worsening bilateral upper and lower extremity sensory loss, weakness, gait abnormality, especially bilateral hand muscle weakness, difficult to make a tight grip, difficulty with finger abduction, adduction.  Cervical dystonia  Moderately  right turn, moderate left tilt,   Used BOTOX A 200 units.  Left sternocleidomastoid 25 units   Right splenius capitis 25x2= 50 units  Right splenius cervix 25 units x 2= 50 units Right levator scapula 25 units Right longissimus capitis 25 units Right iliocostalis 25 units  Patient tolerated injection well, will return to clinic in 3 months for repeat injection   Marcial Pacas, M.D. Ph.D.  [facility] [address], [place], [place] [address] Ph: [phone] Fax: [phone]  CC: Horald Pollen, MD

## 2021-04-25 NOTE — Addendum Note (Signed)
Addended by: Marcial Pacas on: 04/25/2021 01:21 PM   Modules accepted: Orders

## 2021-04-25 NOTE — Progress Notes (Signed)
Botox 100 units, 2 vials.  [place] 3685-9923-41 Lot [id number] Exp 04/2023

## 2021-06-25 ENCOUNTER — Telehealth: Payer: Self-pay | Admitting: Neurology

## 2021-06-25 NOTE — Telephone Encounter (Signed)
Patient now has [employer]. I submitted PA request. It has been denied because [employer] requires Dysport and Xeomin (both) be tried first before Botox for cervical dystonia. We have an option to appeal.

## 2021-06-26 NOTE — Telephone Encounter (Signed)
Patient was given Botox a injection, last injection was on Apr 25, 2021,  It is okay to switch to equivalent dose of xeomin, please preauthorize for 200 units

## 2021-07-10 NOTE — Telephone Encounter (Signed)
Patient's next appointment is 8/25. I have filled out PA form for [facility] requesting 200 units of Xeomin for G24.3. Will give to MD to sign and I will call patient to make him aware of change.

## 2021-07-16 NOTE — Telephone Encounter (Signed)
Received approval from [facility] for Xeomin. PA reference #[id number] (07/11/21- 07/10/22).

## 2021-07-25 NOTE — Telephone Encounter (Signed)
Patient called today to r/s appointment tomorrow. He requested to be r/s to October due to a change in insurance. He states he will be getting Medicare, he believes it is through [employer]. He provided me with his Medicare number [phone]) he states it won't take effect until September. I r/s him to 10/19, advised him to call me once it goes into effect and he receives all necessary info.

## 2021-07-26 ENCOUNTER — Ambulatory Visit: Payer: Self-pay | Admitting: Neurology

## 2021-08-08 NOTE — Telephone Encounter (Signed)
Received call from patient yesterday regarding insurance. He states he now has [employer]. [employer] is no longer active, [employer] is his only coverage. I updated patient's chart. He is scheduled for a Xeomin injection 10/19 ([employer]BS denied Botox but approved Xeomin) but has not received Xeomin yet. Patient's last injection in May was with Botox. I filled out [employer] PA form- attempting to get Botox [id number] approved again. Included documentation that patient has been treated with Botox for over 2 years with good benefit. Faxed to [facility] & pending.

## 2021-08-09 NOTE — Telephone Encounter (Signed)
Received approval from [facility] for Botox. PA #[id number] (08/08/21- 08/08/22).

## 2021-09-19 ENCOUNTER — Ambulatory Visit: Payer: Medicare HMO | Admitting: Neurology

## 2021-09-19 ENCOUNTER — Encounter: Payer: Self-pay | Admitting: Neurology

## 2021-09-19 VITALS — BP 137/93 | HR 83 | Ht 66.0 in | Wt 166.0 lb

## 2021-09-19 DIAGNOSIS — G243 Spasmodic torticollis: Secondary | ICD-10-CM

## 2021-09-19 MED ORDER — ONABOTULINUMTOXINA 100 UNITS IJ SOLR
200.0000 [IU] | Freq: Once | INTRAMUSCULAR | Status: AC
  Administered 2021-09-19: 200 [IU] via INTRAMUSCULAR

## 2021-09-19 NOTE — Progress Notes (Signed)
PATIENT: Chad Kelley DOB: 22-Aug-1956  Chief Complaint  Patient presents with   Procedure     HISTORICAL  Chad Kelley is a 65 year old male, seen in request by his primary care physician Dr. Mitchel Honour, [facility] for evaluation of cervical dystonia, wants to continue Botox injection through our office, initial evaluation was on January 04, 2019.  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, his mother, daughter, maternal cousin suffered the CMT disease  EMG nerve conduction study in February 2015: Right ulnar motor revealed prolonged latency with decreased amplitude and decreased conduction velocity within the demyelinating range, ulnar sensory nerve was absent, right radial sensory demonstrated prolonged latency decreased amplitude and decreased conduction velocity in the demyelinating range, radial motor also demonstrated prolonged latency conduction velocity within the demyelinating range, 26 m/s  In 1990s, he noticed gradual onset forceful head turning towards the right side, initially he noticed that while driving, gradually getting worse, was seen by [facility] Dr. Maudry Mayhew, began to receive EMG guided Botox injection since 2012, every 3 to 4 months, most documented injection was on March 02, 2018, I attempted to review previous MRI cervical spine CD in 2009 without success, there was no report available.  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   Injection pattern from [facility] on March 02 2018. Used BOTOX A 225 units   SCM Left 50 UNITS   Splenius Capitus 25  Levator Scapulae 25  Trap 40 DISPERSED IN AREAS OF PAIN HE SAID THIS HELPED A LOT LAST TIME.    SCM right NONE  Splenius Capitus 75  Levator Scapulae NONE  Trap 10   UPDATE Jan 21 2019: 1st EMG guided BOTOX A injection, used 300 units    UPDATE July 6th 2020: He responded very well to previous Botox injection on January 21, 2019, we used Botox a 300 units, today he complains of straining at left cervical region  UPDATE Sep 06 2019: He responded well to previous Botox injection in July 2020.  UPDATE Nov 30 2019: He responded very well to previous injection, there was no significant side effect noted.  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  Update January 10, 2021: He did well with Botox 200 units, but complains of pain at right upper neck pain injection site close to right nuchal line  UPDATE May 25th 2022: He responded very well to previous injection, no significant side effect noted  UPDATE Sep 19 2021: He did well with previous injection, no significant side effect  noted  REVIEW OF SYSTEMS: Full 14 system review of systems performed and notable only for  As above All other review of systems were negative.  ALLERGIES: No Known Allergies  HOME MEDICATIONS: Current Outpatient Medications  Medication Sig Dispense Refill   acetic  acid-hydrocortisone (VOSOL-HC) OTIC solution Place 3 drops into the left ear 3 (three) times daily. 10 mL 1   escitalopram (LEXAPRO) 10 MG tablet Take 1 tablet (10 mg total) by mouth daily. 30 tablet 5   fluticasone (FLONASE) 50 MCG/ACT nasal spray 1 spray each nostril twice a day 18.2 mL 10   OnabotulinumtoxinA (BOTOX IM) Inject 200 Units into the muscle every 3 (three) months.      tamsulosin (FLOMAX) 0.4 MG CAPS capsule Take 0.4 mg by mouth.     No current facility-administered medications for this visit.    PAST MEDICAL HISTORY: Past Medical History:  Diagnosis Date   Allergy    Charcot-Marie-Tooth disease    Neuromuscular disorder ([place])    Pancreatitis     PAST SURGICAL HISTORY: Past Surgical History:  Procedure Laterality Date   HERNIA MESH REMOVAL     HERNIA REPAIR     pancreatitis     VASECTOMY      FAMILY HISTORY: Family History  Problem Relation Age of Onset   Diabetes Mother    Charcot-Marie-Tooth disease Mother    Hypertension Father    Cancer Father    Colon cancer Neg Hx    Esophageal cancer Neg Hx    Pancreatic cancer Neg Hx    Rectal cancer Neg Hx    Stomach cancer Neg Hx     SOCIAL HISTORY: Social History   Socioeconomic History   Marital status: Single    Spouse name: Not on file   Number of children: 1   Years of education: college   Highest education level: Not on file  Occupational History   Occupation: Hair Dresser  Tobacco Use   Smoking status: Some Days    Types: Cigars   Smokeless tobacco: Never   Tobacco comments:    ocasionally smoke a cigar  Vaping Use   Vaping Use: Never used  Substance and Sexual Activity   Alcohol use: No   Drug use: No   Sexual activity: Yes  Other Topics Concern   Not on file  Social History Narrative   Lives at home with his wife.   Right-handed.   Caffeine use:  Coffee and diet drinks throughout the day.   Social Determinants of Health   Financial Resource Strain: Not on file  Food  Insecurity: Not on file  Transportation Needs: Not on file  Physical Activity: Not on file  Stress: Not on file  Social Connections: Not on file  Intimate Partner Violence: Not on file     PHYSICAL EXAM   Vitals:   09/19/21 1540  BP: (!) 137/93  Pulse: 83  Weight: 166 lb (75.3 kg)  Height: 5\' 6"  (1.676 m)   Not recorded     Body mass index is 26.79 kg/m.  PHYSICAL EXAMNIATION:  PHYSICAL EXAMNIATION:    Significant atrophy of bilateral intrinsic hand muscles, mild bilateral finger extension  weakness, profound finger abduction weakness, mild to moderate bilateral hands grip weakness.  Atrophy of distal leg muscles below knee, wear rigid bilateral AFO, also has mild bilateral hip flexion weakness.     Cervical dystonia:  He has moderate right turn, moderate left tilt, (he feels that his neck was pushed towards the right  side, pulling backwards towards his left shoulder)  ASSESSMENT AND PLAN  Chad Kelley is a 65 y.o. male   Autosomal dominant Charcot-Marie-Tooth disease, genetically confirmed GJB1 mutation,  Progressive worsening bilateral upper and lower extremity sensory loss, weakness, gait abnormality, especially bilateral hand muscle weakness, difficult to make a tight grip, difficulty with finger abduction, adduction.  Cervical dystonia  Moderately  right turn, moderate left tilt, frequent no-no titubation  Used BOTOX A 200 units.  Left sternocleidomastoid 25 unitsx2=50 units   Right splenius capitis 25x2= 50 units  Right splenius cervix 25 units x 2= 50 units Right longissimus capitis 25 units  Left levator scapula 25 units Patient tolerated injection well, will return to clinic in 3 months for repeat injection   Chad Kelley, M.D. Ph.D.  [facility] [address], [place], [place] [address] Ph: [phone] Fax: [phone]  CC: Horald Pollen, MD

## 2021-09-19 NOTE — Progress Notes (Signed)
**  Botox 100 units x 2 vials, [place] 5749-3552-17, Lot [id number], Exp 10/2023, office supply.//mck,rn**

## 2021-11-07 ENCOUNTER — Other Ambulatory Visit: Payer: Self-pay

## 2021-11-07 ENCOUNTER — Ambulatory Visit (INDEPENDENT_AMBULATORY_CARE_PROVIDER_SITE_OTHER): Payer: Medicare HMO | Admitting: Emergency Medicine

## 2021-11-07 ENCOUNTER — Encounter: Payer: Self-pay | Admitting: Emergency Medicine

## 2021-11-07 VITALS — BP 142/82 | HR 76 | Ht 66.0 in | Wt 164.0 lb

## 2021-11-07 DIAGNOSIS — N138 Other obstructive and reflux uropathy: Secondary | ICD-10-CM

## 2021-11-07 DIAGNOSIS — N401 Enlarged prostate with lower urinary tract symptoms: Secondary | ICD-10-CM

## 2021-11-07 DIAGNOSIS — G709 Myoneural disorder, unspecified: Secondary | ICD-10-CM

## 2021-11-07 DIAGNOSIS — Z13220 Encounter for screening for lipoid disorders: Secondary | ICD-10-CM | POA: Diagnosis not present

## 2021-11-07 DIAGNOSIS — Z130 Encounter for screening for diseases of the blood and blood-forming organs and certain disorders involving the immune mechanism: Secondary | ICD-10-CM

## 2021-11-07 DIAGNOSIS — Z0000 Encounter for general adult medical examination without abnormal findings: Secondary | ICD-10-CM | POA: Diagnosis not present

## 2021-11-07 DIAGNOSIS — Z13228 Encounter for screening for other metabolic disorders: Secondary | ICD-10-CM

## 2021-11-07 DIAGNOSIS — Z23 Encounter for immunization: Secondary | ICD-10-CM | POA: Diagnosis not present

## 2021-11-07 DIAGNOSIS — G600 Hereditary motor and sensory neuropathy: Secondary | ICD-10-CM

## 2021-11-07 DIAGNOSIS — Z1329 Encounter for screening for other suspected endocrine disorder: Secondary | ICD-10-CM

## 2021-11-07 NOTE — Patient Instructions (Signed)

## 2021-11-07 NOTE — Progress Notes (Signed)
ABDOUL Kelley 65 y.o.   Chief Complaint  Patient presents with   Annual Exam    HISTORY OF PRESENT ILLNESS: This is a 65 y.o. male here for annual exam. Has a history of Charcot-Marie-Tooth syndrome. Most recent visit with neurologist as follows: ASSESSMENT AND PLAN   Chad Kelley is a 65 y.o. male   Autosomal dominant Charcot-Marie-Tooth disease, genetically confirmed GJB1 mutation,             Progressive worsening bilateral upper and lower extremity sensory loss, weakness, gait abnormality, especially bilateral hand muscle weakness, difficult to make a tight grip, difficulty with finger abduction, adduction.   Cervical dystonia             Moderately  right turn, mild left tilt, mild retrocollis,    Used BOTOX A 200 units.   Left sternocleidomastoid 25x2= 50 units     Right splenius capitis 25 units  Right splenius cervix 25 units x 2= 50 units Right levator scapula 25 units Right longissimus capitis 25 units Right sternocleidomastoid 25 units   Patient tolerated injection well, will return to clinic in 3 months for repeat injection    Chad Kelley, M.D. Ph.D.   [facility] [address] [place], [place] [address] Ph: [phone] Fax: [phone] Most recent visit with urologist as follows: IMPRESSION   1. BPH with obstruction/lower urinary tract symptoms  2. History of urinary retention   PLAN  UA today = negative for hematuria and UTI   [place] PVR today = 17 cc residual  DISCUSSION All of the patient's questions were answered in the office today. Doing well. Cont tamsulosin.   Return in about 1 year (around 03/26/2022).     HPI   Prior to Admission medications   Medication Sig Start Date End Date Taking? Authorizing Provider  acetic acid-hydrocortisone (VOSOL-HC) OTIC solution Place 3 drops into the left ear 3 (three) times daily. 10/26/18   Chad Pollen, MD  escitalopram (LEXAPRO) 10  MG tablet Take 1 tablet (10 mg total) by mouth daily. 10/26/18   Chad Pollen, MD  fluticasone [facility]) 50 MCG/ACT nasal spray 1 spray each nostril twice a day 11/01/19   Chad Pollen, MD  OnabotulinumtoxinA (BOTOX IM) Inject 200 Units into the muscle every 3 (three) months.     [provider]  tamsulosin (FLOMAX) 0.4 MG CAPS capsule Take 0.4 mg by mouth.    [provider]    No Known Allergies  Patient Active Problem List   Diagnosis Date Noted   Neuromuscular disorder ([place]) 11/06/2020   Gait abnormality 09/01/2020   Cervical dystonia 01/04/2019   CMT (Charcot-Marie-Tooth disease) 01/04/2019   History of elevated PSA 10/26/2018   Charcot-Marie-Tooth disease 10/26/2018    Past Medical History:  Diagnosis Date   Allergy    Charcot-Marie-Tooth disease    Neuromuscular disorder ([place])    Pancreatitis     Past Surgical History:  Procedure Laterality Date   HERNIA MESH REMOVAL     HERNIA REPAIR     pancreatitis     VASECTOMY      Social History   Socioeconomic History   Marital status: Single    Spouse name: Not on file   Number of children: 1   Years of education: college   Highest education level: Not on file  Occupational History  Occupation: Hair Dresser  Tobacco Use   Smoking status: Some Days    Types: Cigars   Smokeless tobacco: Never   Tobacco comments:    ocasionally smoke a cigar  Vaping Use   Vaping Use: Never used  Substance and Sexual Activity   Alcohol use: No   Drug use: No   Sexual activity: Yes  Other Topics Concern   Not on file  Social History Narrative   Lives at home with his wife.   Right-handed.   Caffeine use:  Coffee and diet drinks throughout the day.   Social Determinants of Health   Financial Resource Strain: Not on file  Food Insecurity: Not on file  Transportation Needs: Not on file  Physical Activity: Not on file  Stress: Not on file  Social Connections: Not on file  Intimate  Partner Violence: Not on file    Family History  Problem Relation Age of Onset   Diabetes Mother    Charcot-Marie-Tooth disease Mother    Hypertension Father    Cancer Father    Colon cancer Neg Hx    Esophageal cancer Neg Hx    Pancreatic cancer Neg Hx    Rectal cancer Neg Hx    Stomach cancer Neg Hx      Review of Systems  Constitutional: Negative.  Negative for chills and fever.  HENT: Negative.  Negative for congestion and sore throat.   Respiratory: Negative.  Negative for cough and shortness of breath.   Cardiovascular: Negative.  Negative for chest pain and palpitations.  Gastrointestinal:  Negative for abdominal pain, blood in stool, diarrhea, melena, nausea and vomiting.  Genitourinary: Negative.  Negative for dysuria and hematuria.  Skin: Negative.  Negative for rash.  Neurological: Negative.  Negative for dizziness and headaches.  All other systems reviewed and are negative.  Today's Vitals   11/07/21 1603  BP: (!) 142/82  Pulse: 76  SpO2: 98%  Weight: 164 lb (74.4 kg)  Height: 5\' 6"  (1.676 m)   Body mass index is 26.47 kg/m. The 10-year ASCVD risk score (Arnett DK, et al., 2019) is: 20.6%   Values used to calculate the score:     Age: 29 years     Sex: Male     Is Non-Hispanic African American: No     Diabetic: No     Tobacco smoker: Yes     Systolic Blood Pressure: 884 mmHg     Is BP treated: No     HDL Cholesterol: 44 mg/dL     Total Cholesterol: 193 mg/dL  Physical Exam Vitals reviewed.  Constitutional:      Appearance: Normal appearance.  HENT:     Head: Normocephalic.     Right Ear: Tympanic membrane, ear canal and external ear normal.     Left Ear: Tympanic membrane, ear canal and external ear normal.     Mouth/Throat:     Mouth: Mucous membranes are moist.     Pharynx: Oropharynx is clear.  Eyes:     Extraocular Movements: Extraocular movements intact.     Conjunctiva/sclera: Conjunctivae normal.     Pupils: Pupils are equal, round,  and reactive to light.  Cardiovascular:     Rate and Rhythm: Normal rate and regular rhythm.     Pulses: Normal pulses.     Heart sounds: Normal heart sounds.  Pulmonary:     Effort: Pulmonary effort is normal.     Breath sounds: Normal breath sounds.  Abdominal:     General:  Bowel sounds are normal. There is no distension.     Palpations: Abdomen is soft. There is no mass.     Tenderness: There is no abdominal tenderness.  Musculoskeletal:     Cervical back: Normal range of motion and neck supple.  Skin:    General: Skin is warm and dry.     Capillary Refill: Capillary refill takes less than 2 seconds.  Neurological:     General: No focal deficit present.     Mental Status: He is alert and oriented to person, place, and time.     Comments: Chronic spastic features of neuromuscular disorder  Psychiatric:        Mood and Affect: Mood normal.        Behavior: Behavior normal.     ASSESSMENT & PLAN: Problem List Items Addressed This Visit       Nervous and Auditory   CMT (Charcot-Marie-Tooth disease)   Neuromuscular disorder ([place])   Other Visit Diagnoses     Routine general medical examination at a health care facility    -  Primary   BPH with obstruction/lower urinary tract symptoms       Relevant Orders   PSA(Must document that pt has been informed of limitations of PSA testing.)   Screening for deficiency anemia       Relevant Orders   CBC with Differential   Screening for lipoid disorders       Relevant Orders   Lipid panel   Screening for endocrine, metabolic and immunity disorder       Relevant Orders   Comprehensive metabolic panel   Hemoglobin A1c   Need for immunization against influenza          Modifiable risk factors discussed with patient. Anticipatory guidance according to age provided. The following topics were also discussed: Social Determinants of Health Smoking.  Non-smoker Diet and nutrition Benefits of exercise Cancer screening and review  of colonoscopy report from 2018. Vaccinations recommendations. Cardiovascular risk assessment The 10-year ASCVD risk score (Arnett DK, et al., 2019) is: 20.6%   Values used to calculate the score:     Age: 67 years     Sex: Male     Is Non-Hispanic African American: No     Diabetic: No     Tobacco smoker: Yes     Systolic Blood Pressure: 491 mmHg     Is BP treated: No     HDL Cholesterol: 44 mg/dL     Total Cholesterol: 193 mg/dL Lipid profile done today.  Patient prefers not to take cholesterol medications Mental health including depression and anxiety Fall and accident prevention  Patient Instructions  Health Maintenance, Male Adopting a healthy lifestyle and getting preventive care are important in promoting health and wellness. Ask your health care provider about: The right schedule for you to have regular tests and exams. Things you can do on your own to prevent diseases and keep yourself healthy. What should I know about diet, weight, and exercise? Eat a healthy diet  Eat a diet that includes plenty of vegetables, fruits, low-fat dairy products, and lean protein. Do not eat a lot of foods that are high in solid fats, added sugars, or sodium. Maintain a healthy weight Body mass index (BMI) is a measurement that can be used to identify possible weight problems. It estimates body fat based on height and weight. Your health care provider can help determine your BMI and help you achieve or maintain a healthy weight. Get regular  exercise Get regular exercise. This is one of the most important things you can do for your health. Most adults should: Exercise for at least 150 minutes each week. The exercise should increase your heart rate and make you sweat (moderate-intensity exercise). Do strengthening exercises at least twice a week. This is in addition to the moderate-intensity exercise. Spend less time sitting. Even light physical activity can be beneficial. Watch cholesterol and  blood lipids Have your blood tested for lipids and cholesterol at 64 years of age, then have this test every 5 years. You may need to have your cholesterol levels checked more often if: Your lipid or cholesterol levels are high. You are older than 64 years of age. You are at high risk for heart disease. What should I know about cancer screening? Many types of cancers can be detected early and may often be prevented. Depending on your health history and family history, you may need to have cancer screening at various ages. This may include screening for: Colorectal cancer. Prostate cancer. Skin cancer. Lung cancer. What should I know about heart disease, diabetes, and high blood pressure? Blood pressure and heart disease High blood pressure causes heart disease and increases the risk of stroke. This is more likely to develop in people who have high blood pressure readings or are overweight. Talk with your health care provider about your target blood pressure readings. Have your blood pressure checked: Every 3-5 years if you are 49-38 years of age. Every year if you are 12 years old or older. If you are between the ages of 83 and 46 and are a current or former smoker, ask your health care provider if you should have a one-time screening for abdominal aortic aneurysm (AAA). Diabetes Have regular diabetes screenings. This checks your fasting blood sugar level. Have the screening done: Once every three years after age 42 if you are at a normal weight and have a low risk for diabetes. More often and at a younger age if you are overweight or have a high risk for diabetes. What should I know about preventing infection? Hepatitis B If you have a higher risk for hepatitis B, you should be screened for this virus. Talk with your health care provider to find out if you are at risk for hepatitis B infection. Hepatitis C Blood testing is recommended for: Everyone born from [birth year] through 1964. Anyone  with known risk factors for hepatitis C. Sexually transmitted infections (STIs) You should be screened each year for STIs, including gonorrhea and chlamydia, if: You are sexually active and are younger than 64 years of age. You are older than 64 years of age and your health care provider tells you that you are at risk for this type of infection. Your sexual activity has changed since you were last screened, and you are at increased risk for chlamydia or gonorrhea. Ask your health care provider if you are at risk. Ask your health care provider about whether you are at high risk for HIV. Your health care provider may recommend a prescription medicine to help prevent HIV infection. If you choose to take medicine to prevent HIV, you should first get tested for HIV. You should then be tested every 3 months for as long as you are taking the medicine. Follow these instructions at home: Alcohol use Do not drink alcohol if your health care provider tells you not to drink. If you drink alcohol: Limit how much you have to 0-2 drinks a day. Know how  much alcohol is in your drink. In the [place] one drink equals one 12 oz bottle of beer (355 mL), one 5 oz glass of wine (148 mL), or one 1 oz glass of hard liquor (44 mL). Lifestyle Do not use any products that contain nicotine or tobacco. These products include cigarettes, chewing tobacco, and vaping devices, such as e-cigarettes. If you need help quitting, ask your health care provider. Do not use street drugs. Do not share needles. Ask your health care provider for help if you need support or information about quitting drugs. General instructions Schedule regular health, dental, and eye exams. Stay current with your vaccines. Tell your health care provider if: You often feel depressed. You have ever been abused or do not feel safe at home. Summary Adopting a healthy lifestyle and getting preventive care are important in promoting health and  wellness. Follow your health care provider's instructions about healthy diet, exercising, and getting tested or screened for diseases. Follow your health care provider's instructions on monitoring your cholesterol and blood pressure. This information is not intended to replace advice given to you by your health care provider. Make sure you discuss any questions you have with your health care provider. Document Revised: 04/09/2021 Document Reviewed: 04/09/2021 Elsevier Patient Education  2022 Ford Cliff, MD [facility]

## 2021-11-08 ENCOUNTER — Encounter: Payer: Self-pay | Admitting: Emergency Medicine

## 2021-11-08 LAB — COMPREHENSIVE METABOLIC PANEL
ALT: 19 U/L (ref 0–53)
AST: 18 U/L (ref 0–37)
Albumin: 4.1 g/dL (ref 3.5–5.2)
Alkaline Phosphatase: 56 U/L (ref 39–117)
BUN: 18 mg/dL (ref 6–23)
CO2: 29 mEq/L (ref 19–32)
Calcium: 9.3 mg/dL (ref 8.4–10.5)
Chloride: 102 mEq/L (ref 96–112)
Creatinine, Ser: 0.82 mg/dL (ref 0.40–1.50)
GFR: 93.04 mL/min (ref >60.00)
Glucose, Bld: 84 mg/dL (ref 70–99)
Potassium: 4.2 mEq/L (ref 3.5–5.1)
Sodium: 137 mEq/L (ref 135–145)
Total Bilirubin: 0.5 mg/dL (ref 0.2–1.2)
Total Protein: 7.1 g/dL (ref 6.0–8.3)

## 2021-11-08 LAB — CBC WITH DIFFERENTIAL/PLATELET
Basophils Absolute: 0.1 10*3/uL (ref 0.0–0.1)
Basophils Relative: 1 % (ref 0.0–3.0)
Eosinophils Absolute: 0.2 10*3/uL (ref 0.0–0.7)
Eosinophils Relative: 3.1 % (ref 0.0–5.0)
HCT: 48.1 % (ref 39.0–52.0)
Hemoglobin: 16.6 g/dL (ref 13.0–17.0)
Lymphocytes Relative: 19.7 % (ref 12.0–46.0)
Lymphs Abs: 1.4 10*3/uL (ref 0.7–4.0)
MCHC: 34.5 g/dL (ref 30.0–36.0)
MCV: 94.2 fl (ref 78.0–100.0)
Monocytes Absolute: 0.6 10*3/uL (ref 0.1–1.0)
Monocytes Relative: 9 % (ref 3.0–12.0)
Neutro Abs: 4.6 10*3/uL (ref 1.4–7.7)
Neutrophils Relative %: 67.2 % (ref 43.0–77.0)
Platelets: 245 10*3/uL (ref 150.0–400.0)
RBC: 5.1 Mil/uL (ref 4.22–5.81)
RDW: 13.1 % (ref 11.5–15.5)
WBC: 6.9 10*3/uL (ref 4.0–10.5)

## 2021-11-08 LAB — LIPID PANEL
Cholesterol: 161 mg/dL (ref 0–200)
HDL: 44 mg/dL (ref >39.00)
LDL Cholesterol: 96 mg/dL (ref 0–99)
NonHDL: 117.08
Total CHOL/HDL Ratio: 4
Triglycerides: 107 mg/dL (ref 0.0–149.0)
VLDL: 21.4 mg/dL (ref 0.0–40.0)

## 2021-11-08 LAB — HEMOGLOBIN A1C: Hgb A1c MFr Bld: 5.5 % (ref 4.6–6.5)

## 2021-11-08 LAB — PSA: PSA: 2.73 ng/mL (ref 0.10–4.00)

## 2021-12-19 ENCOUNTER — Ambulatory Visit: Payer: Medicare HMO | Admitting: Neurology

## 2021-12-19 DIAGNOSIS — G243 Spasmodic torticollis: Secondary | ICD-10-CM | POA: Diagnosis not present

## 2021-12-19 MED ORDER — ONABOTULINUMTOXINA 100 UNITS IJ SOLR
200.0000 [IU] | Freq: Once | INTRAMUSCULAR | Status: AC
  Administered 2021-12-19: 200 [IU] via INTRAMUSCULAR

## 2021-12-19 NOTE — Progress Notes (Signed)
PATIENT: Chad Kelley DOB: 08-23-1956  Chief Complaint  Patient presents with   Procedure    Botox      HISTORICAL  Chad Kelley is a 66 year old male, seen in request by his primary care physician Dr. Mitchel Honour, [facility] for evaluation of cervical dystonia, wants to continue Botox injection through our office, initial evaluation was on January 04, 2019.  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 Tillman Abide at [facility], CMT genetic testing revealed GJB1 mutation, his mother, daughter, maternal cousin suffered the CMT disease  EMG nerve conduction study in February 2015: Right ulnar motor revealed prolonged latency with decreased amplitude and decreased conduction velocity within the demyelinating range, ulnar sensory nerve was absent, right radial sensory demonstrated prolonged latency decreased amplitude and decreased conduction velocity in the demyelinating range, radial motor also demonstrated prolonged latency conduction velocity within the demyelinating range, 26 m/s  In 1990s, he noticed gradual onset forceful head turning towards the right side, initially he noticed that while driving, gradually getting worse, was seen by [facility] Dr. Maudry Mayhew, began to receive EMG guided Botox injection since 2012, every 3 to 4 months, most documented injection was on March 02, 2018, I attempted to review previous MRI cervical spine CD in 2009 without success, there was no report available.  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   Injection pattern from [facility] on March 02 2018. Used BOTOX A 225 units   SCM Left 50 UNITS   Splenius Capitus 25  Levator Scapulae 25  Trap 40 DISPERSED IN AREAS OF PAIN HE SAID THIS HELPED A LOT LAST TIME.    SCM right NONE  Splenius Capitus 75  Levator Scapulae NONE  Trap 10   UPDATE Jan 21 2019: 1st EMG guided BOTOX A injection, used 300 units    UPDATE July 6th 2020: He responded very well to previous Botox injection on January 21, 2019, we used Botox a 300 units, today he complains of straining at left cervical region  UPDATE Sep 06 2019: He responded well to previous Botox injection in July 2020.  UPDATE Nov 30 2019: He responded very well to previous injection, there was no significant side effect noted.  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  Update January 10, 2021: He did well with Botox 200 units, but complains of pain at right upper neck pain injection site close to right nuchal line  UPDATE May 25th 2022: He responded very well to previous injection, no significant side effect noted  UPDATE Sep 19 2021: He did well with previous  injection, no significant side effect noted  Update January 2023:  He reported suboptimal response to previous injection,  REVIEW OF SYSTEMS: Full 14 system review of systems performed and notable only for  As above All other review of systems were negative.  PHYSICAL EXAM  PHYSICAL  EXAMNIATION:    Significant atrophy of bilateral intrinsic hand muscles, mild bilateral finger extension  weakness, profound finger abduction weakness, mild to moderate bilateral hands grip weakness.  Atrophy of distal leg muscles below knee, wear rigid bilateral AFO, also has mild bilateral hip flexion weakness.     Cervical dystonia: Has moderate right turn, (he feels that his neck was pushed towards the right side, pulling backwards towards his right shoulder, slight anterocollis  ASSESSMENT AND PLAN  Chad Kelley is a 66 y.o. male   Autosomal dominant Charcot-Marie-Tooth disease, genetically confirmed GJB1 mutation,  Progressive worsening bilateral upper and lower extremity sensory loss, weakness, gait abnormality, especially bilateral hand muscle weakness, difficult to make a tight grip, difficulty with finger abduction, adduction.  Cervical dystonia  Moderately  right turn, mild anterocollis, frequent no-no titubation  Used BOTOX A 200 units.  Left sternocleidomastoid 25 unitsx2=50 units   Right splenius capitis 25x2= 50 units  Right splenius cervix 25 units x 3= 75 units Right longissimus capitis 25 units  Left sternocleidomastoid 25x2= 50 units   Marcial Pacas, M.D. Ph.D.  [facility] [address] [place], [place] [address] Ph: [phone] Fax: [phone]  CC: Horald Pollen, MD

## 2021-12-19 NOTE — Progress Notes (Signed)
Botox 200 units  [id number] Exp-10/2023 [id number] B/B

## 2022-03-20 ENCOUNTER — Ambulatory Visit: Payer: Medicare HMO | Admitting: Neurology

## 2022-07-01 DIAGNOSIS — N138 Other obstructive and reflux uropathy: Secondary | ICD-10-CM | POA: Diagnosis not present

## 2022-07-01 DIAGNOSIS — N401 Enlarged prostate with lower urinary tract symptoms: Secondary | ICD-10-CM | POA: Diagnosis not present

## 2022-10-14 ENCOUNTER — Telehealth: Payer: Self-pay | Admitting: Emergency Medicine

## 2022-10-14 NOTE — Telephone Encounter (Signed)
LVM for pt to rtn my call to schedule AWV with [facility]. Call back # [phone]

## 2022-11-07 ENCOUNTER — Ambulatory Visit: Payer: Medicare HMO | Admitting: Emergency Medicine

## 2022-11-11 ENCOUNTER — Encounter: Payer: Self-pay | Admitting: Emergency Medicine

## 2022-11-11 ENCOUNTER — Ambulatory Visit (INDEPENDENT_AMBULATORY_CARE_PROVIDER_SITE_OTHER): Payer: Medicare HMO | Admitting: Emergency Medicine

## 2022-11-11 VITALS — BP 126/78 | HR 86 | Temp 98.4°F | Ht 66.0 in | Wt 168.0 lb

## 2022-11-11 DIAGNOSIS — G600 Hereditary motor and sensory neuropathy: Secondary | ICD-10-CM

## 2022-11-11 DIAGNOSIS — N401 Enlarged prostate with lower urinary tract symptoms: Secondary | ICD-10-CM

## 2022-11-11 DIAGNOSIS — N138 Other obstructive and reflux uropathy: Secondary | ICD-10-CM | POA: Diagnosis not present

## 2022-11-11 DIAGNOSIS — Z13228 Encounter for screening for other metabolic disorders: Secondary | ICD-10-CM | POA: Diagnosis not present

## 2022-11-11 DIAGNOSIS — Z130 Encounter for screening for diseases of the blood and blood-forming organs and certain disorders involving the immune mechanism: Secondary | ICD-10-CM

## 2022-11-11 DIAGNOSIS — Z23 Encounter for immunization: Secondary | ICD-10-CM

## 2022-11-11 DIAGNOSIS — Z1211 Encounter for screening for malignant neoplasm of colon: Secondary | ICD-10-CM | POA: Diagnosis not present

## 2022-11-11 DIAGNOSIS — Z1329 Encounter for screening for other suspected endocrine disorder: Secondary | ICD-10-CM | POA: Diagnosis not present

## 2022-11-11 DIAGNOSIS — R300 Dysuria: Secondary | ICD-10-CM | POA: Diagnosis not present

## 2022-11-11 DIAGNOSIS — Z0000 Encounter for general adult medical examination without abnormal findings: Secondary | ICD-10-CM | POA: Diagnosis not present

## 2022-11-11 DIAGNOSIS — Z13220 Encounter for screening for lipoid disorders: Secondary | ICD-10-CM | POA: Diagnosis not present

## 2022-11-11 LAB — URINALYSIS
Bilirubin Urine: NEGATIVE
Hgb urine dipstick: NEGATIVE
Ketones, ur: NEGATIVE
Leukocytes,Ua: NEGATIVE
Nitrite: NEGATIVE
Specific Gravity, Urine: <=1.005 — AB (ref 1.000–1.030)
Total Protein, Urine: NEGATIVE
Urine Glucose: NEGATIVE
Urobilinogen, UA: 0.2 (ref 0.0–1.0)
pH: 6.5 (ref 5.0–8.0)

## 2022-11-11 LAB — COMPREHENSIVE METABOLIC PANEL
ALT: 22 U/L (ref 0–53)
AST: 15 U/L (ref 0–37)
Albumin: 4.3 g/dL (ref 3.5–5.2)
Alkaline Phosphatase: 54 U/L (ref 39–117)
BUN: 15 mg/dL (ref 6–23)
CO2: 29 mEq/L (ref 19–32)
Calcium: 9.5 mg/dL (ref 8.4–10.5)
Chloride: 101 mEq/L (ref 96–112)
Creatinine, Ser: 0.83 mg/dL (ref 0.40–1.50)
GFR: 92.05 mL/min (ref >60.00)
Glucose, Bld: 87 mg/dL (ref 70–99)
Potassium: 4.2 mEq/L (ref 3.5–5.1)
Sodium: 138 mEq/L (ref 135–145)
Total Bilirubin: 0.5 mg/dL (ref 0.2–1.2)
Total Protein: 7.4 g/dL (ref 6.0–8.3)

## 2022-11-11 LAB — LIPID PANEL
Cholesterol: 178 mg/dL (ref 0–200)
HDL: 42.3 mg/dL (ref >39.00)
LDL Cholesterol: 114 mg/dL — ABNORMAL HIGH (ref 0–99)
NonHDL: 135.65
Total CHOL/HDL Ratio: 4
Triglycerides: 108 mg/dL (ref 0.0–149.0)
VLDL: 21.6 mg/dL (ref 0.0–40.0)

## 2022-11-11 LAB — CBC WITH DIFFERENTIAL/PLATELET
Basophils Absolute: 0 10*3/uL (ref 0.0–0.1)
Basophils Relative: 0.6 % (ref 0.0–3.0)
Eosinophils Absolute: 0.2 10*3/uL (ref 0.0–0.7)
Eosinophils Relative: 2.2 % (ref 0.0–5.0)
HCT: 48.9 % (ref 39.0–52.0)
Hemoglobin: 17.1 g/dL — ABNORMAL HIGH (ref 13.0–17.0)
Lymphocytes Relative: 23.1 % (ref 12.0–46.0)
Lymphs Abs: 1.8 10*3/uL (ref 0.7–4.0)
MCHC: 34.9 g/dL (ref 30.0–36.0)
MCV: 93.6 fl (ref 78.0–100.0)
Monocytes Absolute: 0.4 10*3/uL (ref 0.1–1.0)
Monocytes Relative: 5.7 % (ref 3.0–12.0)
Neutro Abs: 5.4 10*3/uL (ref 1.4–7.7)
Neutrophils Relative %: 68.4 % (ref 43.0–77.0)
Platelets: 336 10*3/uL (ref 150.0–400.0)
RBC: 5.23 Mil/uL (ref 4.22–5.81)
RDW: 12.9 % (ref 11.5–15.5)
WBC: 7.9 10*3/uL (ref 4.0–10.5)

## 2022-11-11 LAB — PSA: PSA: 5.07 ng/mL — ABNORMAL HIGH (ref 0.10–4.00)

## 2022-11-11 LAB — HEMOGLOBIN A1C: Hgb A1c MFr Bld: 5.6 % (ref 4.6–6.5)

## 2022-11-11 NOTE — Patient Instructions (Signed)
Health Maintenance, Male Adopting a healthy lifestyle and getting preventive care are important in promoting health and wellness. Ask your health care provider about: The right schedule for you to have regular tests and exams. Things you can do on your own to prevent diseases and keep yourself healthy. What should I know about diet, weight, and exercise? Eat a healthy diet  Eat a diet that includes plenty of vegetables, fruits, low-fat dairy products, and lean protein. Do not eat a lot of foods that are high in solid fats, added sugars, or sodium. Maintain a healthy weight Body mass index (BMI) is a measurement that can be used to identify possible weight problems. It estimates body fat based on height and weight. Your health care provider can help determine your BMI and help you achieve or maintain a healthy weight. Get regular exercise Get regular exercise. This is one of the most important things you can do for your health. Most adults should: Exercise for at least 150 minutes each week. The exercise should increase your heart rate and make you sweat (moderate-intensity exercise). Do strengthening exercises at least twice a week. This is in addition to the moderate-intensity exercise. Spend less time sitting. Even light physical activity can be beneficial. Watch cholesterol and blood lipids Have your blood tested for lipids and cholesterol at 65 years of age, then have this test every 5 years. You may need to have your cholesterol levels checked more often if: Your lipid or cholesterol levels are high. You are older than 65 years of age. You are at high risk for heart disease. What should I know about cancer screening? Many types of cancers can be detected early and may often be prevented. Depending on your health history and family history, you may need to have cancer screening at various ages. This may include screening for: Colorectal cancer. Prostate cancer. Skin cancer. Lung  cancer. What should I know about heart disease, diabetes, and high blood pressure? Blood pressure and heart disease High blood pressure causes heart disease and increases the risk of stroke. This is more likely to develop in people who have high blood pressure readings or are overweight. Talk with your health care provider about your target blood pressure readings. Have your blood pressure checked: Every 3-5 years if you are 18-39 years of age. Every year if you are 40 years old or older. If you are between the ages of 65 and 75 and are a current or former smoker, ask your health care provider if you should have a one-time screening for abdominal aortic aneurysm (AAA). Diabetes Have regular diabetes screenings. This checks your fasting blood sugar level. Have the screening done: Once every three years after age 45 if you are at a normal weight and have a low risk for diabetes. More often and at a younger age if you are overweight or have a high risk for diabetes. What should I know about preventing infection? Hepatitis B If you have a higher risk for hepatitis B, you should be screened for this virus. Talk with your health care provider to find out if you are at risk for hepatitis B infection. Hepatitis C Blood testing is recommended for: Everyone born from 1945 through 1965. Anyone with known risk factors for hepatitis C. Sexually transmitted infections (STIs) You should be screened each year for STIs, including gonorrhea and chlamydia, if: You are sexually active and are younger than 65 years of age. You are older than 65 years of age and your   health care provider tells you that you are at risk for this type of infection. Your sexual activity has changed since you were last screened, and you are at increased risk for chlamydia or gonorrhea. Ask your health care provider if you are at risk. Ask your health care provider about whether you are at high risk for HIV. Your health care provider  may recommend a prescription medicine to help prevent HIV infection. If you choose to take medicine to prevent HIV, you should first get tested for HIV. You should then be tested every 3 months for as long as you are taking the medicine. Follow these instructions at home: Alcohol use Do not drink alcohol if your health care provider tells you not to drink. If you drink alcohol: Limit how much you have to 0-2 drinks a day. Know how much alcohol is in your drink. In the [place] one drink equals one 12 oz bottle of beer (355 mL), one 5 oz glass of wine (148 mL), or one 1 oz glass of hard liquor (44 mL). Lifestyle Do not use any products that contain nicotine or tobacco. These products include cigarettes, chewing tobacco, and vaping devices, such as e-cigarettes. If you need help quitting, ask your health care provider. Do not use street drugs. Do not share needles. Ask your health care provider for help if you need support or information about quitting drugs. General instructions Schedule regular health, dental, and eye exams. Stay current with your vaccines. Tell your health care provider if: You often feel depressed. You have ever been abused or do not feel safe at home. Summary Adopting a healthy lifestyle and getting preventive care are important in promoting health and wellness. Follow your health care provider's instructions about healthy diet, exercising, and getting tested or screened for diseases. Follow your health care provider's instructions on monitoring your cholesterol and blood pressure. This information is not intended to replace advice given to you by your health care provider. Make sure you discuss any questions you have with your health care provider. Document Revised: 04/09/2021 Document Reviewed: 04/09/2021 Elsevier Patient Education  2023 Elsevier Inc.  

## 2022-11-11 NOTE — Assessment & Plan Note (Signed)
Stable.  Continue tamsulosin 0.4 mg at bedtime Last urologist office visit assessment and plan as follows: Luana Shu, MD - 07/01/2022 8:45 AM EDT Formatting of this note is different from the original. Arma Heading, MD [facility] [address] [place], [place], [address] P. [phone]  Date of Service:07/01/22   Assessment:  BPH with LUTS  Plan: 1. Continue tamsulosin 2. Encoruaged double voiding 3. Continue prostate cancer screening with PCP 4. RTC in 1 year

## 2022-11-11 NOTE — Progress Notes (Signed)
Chad Kelley 66 y.o.   Chief Complaint  Patient presents with   Physical    No concerns or questions    HISTORY OF PRESENT ILLNESS: This is a 66 y.o. male here for annual physical. Has no complaints or medical concerns today.  HPI   Prior to Admission medications   Medication Sig Start Date End Date Taking? Authorizing Provider  fluticasone (FLONASE) 50 MCG/ACT nasal spray 1 spray each nostril twice a day 11/01/19  Yes Latonja Bobeck, Ines Bloomer, MD  tamsulosin (FLOMAX) 0.4 MG CAPS capsule Take 0.4 mg by mouth.   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   History of elevated PSA 10/26/2018   Charcot-Marie-Tooth disease 10/26/2018    Past Medical History:  Diagnosis Date   Allergy    Charcot-Marie-Tooth disease    Neuromuscular disorder ([place])    Pancreatitis     Past Surgical History:  Procedure Laterality Date   HERNIA MESH REMOVAL     HERNIA REPAIR     pancreatitis     VASECTOMY      Social History   Socioeconomic History   Marital status: Single    Spouse name: Not on file   Number of children: 1   Years of education: college   Highest education level: Not on file  Occupational History   Occupation: Hair Dresser  Tobacco Use   Smoking status: Some Days    Types: Cigars   Smokeless tobacco: Never   Tobacco comments:    ocasionally smoke a cigar  Vaping Use   Vaping Use: Never used  Substance and Sexual Activity   Alcohol use: No   Drug use: No   Sexual activity: Yes  Other Topics Concern   Not on file  Social History Narrative   Lives at home with his wife.   Right-handed.   Caffeine use:  Coffee and diet drinks throughout the  day.   Social Determinants of Health   Financial Resource Strain: Not on file  Food Insecurity: Not on file  Transportation Needs: Not on file  Physical Activity: Not on file  Stress: Not on file  Social Connections: Not on file  Intimate Partner Violence: Not on file    Family History  Problem Relation Age of Onset   Diabetes Mother    Charcot-Marie-Tooth disease Mother    Hypertension Father    Cancer Father    Colon cancer Neg Hx    Esophageal cancer Neg Hx    Pancreatic cancer Neg Hx    Rectal cancer Neg Hx    Stomach cancer Neg Hx      Review of Systems  Constitutional: Negative.  Negative for chills and fever.  HENT: Negative.  Negative for congestion and sore throat.   Respiratory: Negative.  Negative for cough and shortness of breath.   Cardiovascular: Negative.  Negative for chest pain and palpitations.  Gastrointestinal:  Negative for abdominal pain, nausea and vomiting.  Genitourinary:  Positive for dysuria. Negative for flank pain.  Skin: Negative.  Negative for rash.  Neurological:  Negative for dizziness and headaches.  All other systems reviewed and are negative.  Vitals:   11/11/22 1346  BP: 126/78  Pulse: 86  Temp: 98.4 F (36.9 C)  SpO2: 94%     Physical Exam Constitutional:      Appearance: Normal appearance.  HENT:     Head: Normocephalic.     Right Ear: Tympanic membrane, ear canal and external ear normal.     Left Ear: Tympanic membrane, ear canal and external ear normal.     Mouth/Throat:     Mouth: Mucous membranes are moist.     Pharynx: Oropharynx is clear.  Eyes:     Extraocular Movements: Extraocular movements intact.     Conjunctiva/sclera: Conjunctivae normal.     Pupils: Pupils are equal, round, and reactive to light.  Cardiovascular:     Rate and Rhythm: Normal rate and regular rhythm.     Pulses: Normal pulses.     Heart sounds: Normal heart sounds.  Pulmonary:     Effort: Pulmonary effort is normal.     Breath  sounds: Normal breath sounds.  Abdominal:     General: There is no distension.     Palpations: Abdomen is soft.     Tenderness: There is no abdominal tenderness.  Musculoskeletal:     Cervical back: No tenderness.     Right lower leg: No edema.     Left lower leg: No edema.  Lymphadenopathy:     Cervical: No cervical adenopathy.  Skin:    General: Skin is warm and dry.  Neurological:     Mental Status: He is alert and oriented to person, place, and time. Mental status is at baseline.     Comments: Has proximal muscle weakness and muscle atrophy as part of chronic neurological condition  Psychiatric:        Mood and Affect: Mood normal.        Behavior: Behavior normal.      ASSESSMENT & PLAN: Problem List Items Addressed This Visit       Nervous and Auditory   CMT (Charcot-Marie-Tooth disease)    Stable.  No longer getting Botox injections. Last neurology visit 1 year ago.        Genitourinary   BPH with obstruction/lower urinary tract symptoms    Stable.  Continue tamsulosin 0.4 mg at bedtime Last urologist office visit assessment and plan as follows: Luana Shu, MD - 07/01/2022 8:45 AM EDT Formatting of this note is different from the original. Arma Heading, MD [facility] [address] [place], [place], [address] P. [phone]  Date of Service:07/01/22   Assessment:  BPH with LUTS  Plan: 1. Continue tamsulosin 2. Encoruaged double voiding 3. Continue prostate cancer screening with PCP 4. RTC in 1 year      Relevant Orders   PSA(Must document that pt has been informed of limitations of PSA testing.)   Other Visit Diagnoses     Routine general medical examination at a health care facility    -  Primary   Relevant Orders   CBC with Differential   Comprehensive metabolic panel   Hemoglobin A1c   Lipid panel   Need for vaccination       Relevant Orders   Pneumococcal conjugate vaccine  20-valent (Prevnar 20) (Completed)   Flu Vaccine QUAD High Dose(Fluad) (Completed)   Dysuria       Relevant Orders   Urinalysis   Urine Culture   Colon cancer screening       Relevant Orders   Cologuard   Screening for deficiency anemia  Relevant Orders   CBC with Differential   Screening for lipoid disorders       Screening for endocrine, metabolic and immunity disorder          Patient Instructions  Health Maintenance, Male Adopting a healthy lifestyle and getting preventive care are important in promoting health and wellness. Ask your health care provider about: The right schedule for you to have regular tests and exams. Things you can do on your own to prevent diseases and keep yourself healthy. What should I know about diet, weight, and exercise? Eat a healthy diet  Eat a diet that includes plenty of vegetables, fruits, low-fat dairy products, and lean protein. Do not eat a lot of foods that are high in solid fats, added sugars, or sodium. Maintain a healthy weight Body mass index (BMI) is a measurement that can be used to identify possible weight problems. It estimates body fat based on height and weight. Your health care provider can help determine your BMI and help you achieve or maintain a healthy weight. Get regular exercise Get regular exercise. This is one of the most important things you can do for your health. Most adults should: Exercise for at least 150 minutes each week. The exercise should increase your heart rate and make you sweat (moderate-intensity exercise). Do strengthening exercises at least twice a week. This is in addition to the moderate-intensity exercise. Spend less time sitting. Even light physical activity can be beneficial. Watch cholesterol and blood lipids Have your blood tested for lipids and cholesterol at 65 years of age, then have this test every 5 years. You may need to have your cholesterol levels checked more often if: Your lipid or  cholesterol levels are high. You are older than 65 years of age. You are at high risk for heart disease. What should I know about cancer screening? Many types of cancers can be detected early and may often be prevented. Depending on your health history and family history, you may need to have cancer screening at various ages. This may include screening for: Colorectal cancer. Prostate cancer. Skin cancer. Lung cancer. What should I know about heart disease, diabetes, and high blood pressure? Blood pressure and heart disease High blood pressure causes heart disease and increases the risk of stroke. This is more likely to develop in people who have high blood pressure readings or are overweight. Talk with your health care provider about your target blood pressure readings. Have your blood pressure checked: Every 3-5 years if you are 26-69 years of age. Every year if you are 30 years old or older. If you are between the ages of 45 and 60 and are a current or former smoker, ask your health care provider if you should have a one-time screening for abdominal aortic aneurysm (AAA). Diabetes Have regular diabetes screenings. This checks your fasting blood sugar level. Have the screening done: Once every three years after age 79 if you are at a normal weight and have a low risk for diabetes. More often and at a younger age if you are overweight or have a high risk for diabetes. What should I know about preventing infection? Hepatitis B If you have a higher risk for hepatitis B, you should be screened for this virus. Talk with your health care provider to find out if you are at risk for hepatitis B infection. Hepatitis C Blood testing is recommended for: Everyone born from [birth year] through 1965. Anyone with known risk factors for hepatitis C. Sexually  transmitted infections (STIs) You should be screened each year for STIs, including gonorrhea and chlamydia, if: You are sexually active and are younger  than 65 years of age. You are older than 65 years of age and your health care provider tells you that you are at risk for this type of infection. Your sexual activity has changed since you were last screened, and you are at increased risk for chlamydia or gonorrhea. Ask your health care provider if you are at risk. Ask your health care provider about whether you are at high risk for HIV. Your health care provider may recommend a prescription medicine to help prevent HIV infection. If you choose to take medicine to prevent HIV, you should first get tested for HIV. You should then be tested every 3 months for as long as you are taking the medicine. Follow these instructions at home: Alcohol use Do not drink alcohol if your health care provider tells you not to drink. If you drink alcohol: Limit how much you have to 0-2 drinks a day. Know how much alcohol is in your drink. In the [place] one drink equals one 12 oz bottle of beer (355 mL), one 5 oz glass of wine (148 mL), or one 1 oz glass of hard liquor (44 mL). Lifestyle Do not use any products that contain nicotine or tobacco. These products include cigarettes, chewing tobacco, and vaping devices, such as e-cigarettes. If you need help quitting, ask your health care provider. Do not use street drugs. Do not share needles. Ask your health care provider for help if you need support or information about quitting drugs. General instructions Schedule regular health, dental, and eye exams. Stay current with your vaccines. Tell your health care provider if: You often feel depressed. You have ever been abused or do not feel safe at home. Summary Adopting a healthy lifestyle and getting preventive care are important in promoting health and wellness. Follow your health care provider's instructions about healthy diet, exercising, and getting tested or screened for diseases. Follow your health care provider's instructions on monitoring your cholesterol and  blood pressure. This information is not intended to replace advice given to you by your health care provider. Make sure you discuss any questions you have with your health care provider. Document Revised: 04/09/2021 Document Reviewed: 04/09/2021 Elsevier Patient Education  [place], MD [facility]

## 2022-11-11 NOTE — Assessment & Plan Note (Signed)
Stable.  No longer getting Botox injections. Last neurology visit 1 year ago.

## 2022-11-12 LAB — URINE CULTURE: Result:: NO GROWTH

## 2022-12-03 DIAGNOSIS — Z1211 Encounter for screening for malignant neoplasm of colon: Secondary | ICD-10-CM | POA: Diagnosis not present

## 2022-12-07 LAB — COLOGUARD: COLOGUARD: NEGATIVE

## 2022-12-17 ENCOUNTER — Encounter: Payer: Self-pay | Admitting: Gastroenterology

## 2022-12-17 DIAGNOSIS — R338 Other retention of urine: Secondary | ICD-10-CM | POA: Diagnosis not present

## 2022-12-17 DIAGNOSIS — N401 Enlarged prostate with lower urinary tract symptoms: Secondary | ICD-10-CM | POA: Diagnosis not present

## 2022-12-17 DIAGNOSIS — G600 Hereditary motor and sensory neuropathy: Secondary | ICD-10-CM | POA: Diagnosis not present

## 2022-12-17 DIAGNOSIS — M436 Torticollis: Secondary | ICD-10-CM | POA: Diagnosis not present

## 2022-12-24 DIAGNOSIS — H2513 Age-related nuclear cataract, bilateral: Secondary | ICD-10-CM | POA: Diagnosis not present

## 2022-12-26 ENCOUNTER — Encounter: Payer: Self-pay | Admitting: Emergency Medicine

## 2023-01-27 DIAGNOSIS — R310 Gross hematuria: Secondary | ICD-10-CM | POA: Diagnosis not present

## 2023-01-27 DIAGNOSIS — N138 Other obstructive and reflux uropathy: Secondary | ICD-10-CM | POA: Diagnosis not present

## 2023-01-27 DIAGNOSIS — N401 Enlarged prostate with lower urinary tract symptoms: Secondary | ICD-10-CM | POA: Diagnosis not present

## 2023-02-06 DIAGNOSIS — N401 Enlarged prostate with lower urinary tract symptoms: Secondary | ICD-10-CM | POA: Diagnosis not present

## 2023-02-06 DIAGNOSIS — R310 Gross hematuria: Secondary | ICD-10-CM | POA: Diagnosis not present

## 2023-02-06 DIAGNOSIS — R338 Other retention of urine: Secondary | ICD-10-CM | POA: Diagnosis not present

## 2023-02-06 DIAGNOSIS — Z87440 Personal history of urinary (tract) infections: Secondary | ICD-10-CM | POA: Diagnosis not present

## 2023-02-06 DIAGNOSIS — N3289 Other specified disorders of bladder: Secondary | ICD-10-CM | POA: Diagnosis not present

## 2023-05-19 DIAGNOSIS — R338 Other retention of urine: Secondary | ICD-10-CM | POA: Diagnosis not present

## 2023-05-19 DIAGNOSIS — N401 Enlarged prostate with lower urinary tract symptoms: Secondary | ICD-10-CM | POA: Diagnosis not present

## 2023-05-19 DIAGNOSIS — Z13220 Encounter for screening for lipoid disorders: Secondary | ICD-10-CM | POA: Diagnosis not present

## 2023-05-19 DIAGNOSIS — F17290 Nicotine dependence, other tobacco product, uncomplicated: Secondary | ICD-10-CM | POA: Diagnosis not present

## 2023-05-19 DIAGNOSIS — Z136 Encounter for screening for cardiovascular disorders: Secondary | ICD-10-CM | POA: Diagnosis not present

## 2023-05-19 DIAGNOSIS — Z1159 Encounter for screening for other viral diseases: Secondary | ICD-10-CM | POA: Diagnosis not present

## 2023-05-19 DIAGNOSIS — F411 Generalized anxiety disorder: Secondary | ICD-10-CM | POA: Diagnosis not present

## 2023-05-19 DIAGNOSIS — G709 Myoneural disorder, unspecified: Secondary | ICD-10-CM | POA: Diagnosis not present

## 2023-05-30 DIAGNOSIS — I700 Atherosclerosis of aorta: Secondary | ICD-10-CM | POA: Diagnosis not present

## 2023-05-30 DIAGNOSIS — Z87891 Personal history of nicotine dependence: Secondary | ICD-10-CM | POA: Diagnosis not present

## 2023-05-30 DIAGNOSIS — Z136 Encounter for screening for cardiovascular disorders: Secondary | ICD-10-CM | POA: Diagnosis not present

## 2023-08-06 ENCOUNTER — Telehealth: Payer: Self-pay | Admitting: Emergency Medicine

## 2023-08-06 NOTE — Telephone Encounter (Signed)
Contacted Chad Kelley to schedule their annual wellness visit. Patient declined to schedule AWV at this time. Transferred care   [facility] AWV TEAM Direct Dial: [phone]

## 2023-08-27 DIAGNOSIS — Z0100 Encounter for examination of eyes and vision without abnormal findings: Secondary | ICD-10-CM | POA: Diagnosis not present

## 2023-09-30 DIAGNOSIS — L989 Disorder of the skin and subcutaneous tissue, unspecified: Secondary | ICD-10-CM | POA: Diagnosis not present

## 2023-09-30 DIAGNOSIS — Z0000 Encounter for general adult medical examination without abnormal findings: Secondary | ICD-10-CM | POA: Diagnosis not present

## 2023-09-30 DIAGNOSIS — G600 Hereditary motor and sensory neuropathy: Secondary | ICD-10-CM | POA: Diagnosis not present

## 2023-09-30 DIAGNOSIS — J3089 Other allergic rhinitis: Secondary | ICD-10-CM | POA: Diagnosis not present

## 2023-09-30 DIAGNOSIS — Z23 Encounter for immunization: Secondary | ICD-10-CM | POA: Diagnosis not present

## 2023-09-30 DIAGNOSIS — Z125 Encounter for screening for malignant neoplasm of prostate: Secondary | ICD-10-CM | POA: Diagnosis not present

## 2023-10-06 DIAGNOSIS — L814 Other melanin hyperpigmentation: Secondary | ICD-10-CM | POA: Diagnosis not present

## 2023-10-06 DIAGNOSIS — L821 Other seborrheic keratosis: Secondary | ICD-10-CM | POA: Diagnosis not present

## 2023-10-06 DIAGNOSIS — L570 Actinic keratosis: Secondary | ICD-10-CM | POA: Diagnosis not present

## 2023-11-03 DIAGNOSIS — M21371 Foot drop, right foot: Secondary | ICD-10-CM | POA: Diagnosis not present

## 2023-11-03 DIAGNOSIS — G600 Hereditary motor and sensory neuropathy: Secondary | ICD-10-CM | POA: Diagnosis not present

## 2023-11-03 DIAGNOSIS — M21372 Foot drop, left foot: Secondary | ICD-10-CM | POA: Diagnosis not present

## 2023-11-13 ENCOUNTER — Encounter: Payer: Medicare HMO | Admitting: Emergency Medicine

## 2024-02-20 DIAGNOSIS — M5442 Lumbago with sciatica, left side: Secondary | ICD-10-CM | POA: Diagnosis not present

## 2024-03-19 DIAGNOSIS — N401 Enlarged prostate with lower urinary tract symptoms: Secondary | ICD-10-CM | POA: Diagnosis not present

## 2024-03-19 DIAGNOSIS — R3912 Poor urinary stream: Secondary | ICD-10-CM | POA: Diagnosis not present

## 2024-03-19 DIAGNOSIS — Z87440 Personal history of urinary (tract) infections: Secondary | ICD-10-CM | POA: Diagnosis not present

## 2024-03-19 DIAGNOSIS — R3911 Hesitancy of micturition: Secondary | ICD-10-CM | POA: Diagnosis not present

## 2024-03-26 DIAGNOSIS — M4186 Other forms of scoliosis, lumbar region: Secondary | ICD-10-CM | POA: Diagnosis not present

## 2024-03-26 DIAGNOSIS — M5442 Lumbago with sciatica, left side: Secondary | ICD-10-CM | POA: Diagnosis not present

## 2024-03-26 DIAGNOSIS — M47817 Spondylosis without myelopathy or radiculopathy, lumbosacral region: Secondary | ICD-10-CM | POA: Diagnosis not present

## 2024-04-08 DIAGNOSIS — L821 Other seborrheic keratosis: Secondary | ICD-10-CM | POA: Diagnosis not present

## 2024-04-08 DIAGNOSIS — L570 Actinic keratosis: Secondary | ICD-10-CM | POA: Diagnosis not present

## 2024-04-15 DIAGNOSIS — M47816 Spondylosis without myelopathy or radiculopathy, lumbar region: Secondary | ICD-10-CM | POA: Diagnosis not present

## 2024-04-15 DIAGNOSIS — M48061 Spinal stenosis, lumbar region without neurogenic claudication: Secondary | ICD-10-CM | POA: Diagnosis not present

## 2024-04-15 DIAGNOSIS — M5442 Lumbago with sciatica, left side: Secondary | ICD-10-CM | POA: Diagnosis not present

## 2024-04-22 DIAGNOSIS — M899 Disorder of bone, unspecified: Secondary | ICD-10-CM | POA: Diagnosis not present

## 2024-05-03 DIAGNOSIS — M899 Disorder of bone, unspecified: Secondary | ICD-10-CM | POA: Diagnosis not present

## 2024-05-03 DIAGNOSIS — G9589 Other specified diseases of spinal cord: Secondary | ICD-10-CM | POA: Diagnosis not present

## 2024-05-03 DIAGNOSIS — M47814 Spondylosis without myelopathy or radiculopathy, thoracic region: Secondary | ICD-10-CM | POA: Diagnosis not present

## 2024-05-26 DIAGNOSIS — M48061 Spinal stenosis, lumbar region without neurogenic claudication: Secondary | ICD-10-CM | POA: Diagnosis not present

## 2024-06-17 DIAGNOSIS — Z79899 Other long term (current) drug therapy: Secondary | ICD-10-CM | POA: Diagnosis not present

## 2024-06-17 DIAGNOSIS — G600 Hereditary motor and sensory neuropathy: Secondary | ICD-10-CM | POA: Diagnosis not present

## 2024-06-17 DIAGNOSIS — Z5181 Encounter for therapeutic drug level monitoring: Secondary | ICD-10-CM | POA: Diagnosis not present

## 2024-06-17 DIAGNOSIS — M79604 Pain in right leg: Secondary | ICD-10-CM | POA: Diagnosis not present

## 2024-06-17 DIAGNOSIS — M5417 Radiculopathy, lumbosacral region: Secondary | ICD-10-CM | POA: Diagnosis not present

## 2024-06-17 DIAGNOSIS — M5416 Radiculopathy, lumbar region: Secondary | ICD-10-CM | POA: Diagnosis not present

## 2024-06-17 DIAGNOSIS — M47816 Spondylosis without myelopathy or radiculopathy, lumbar region: Secondary | ICD-10-CM | POA: Diagnosis not present

## 2024-06-17 DIAGNOSIS — G8929 Other chronic pain: Secondary | ICD-10-CM | POA: Diagnosis not present

## 2024-06-17 DIAGNOSIS — M545 Low back pain, unspecified: Secondary | ICD-10-CM | POA: Diagnosis not present

## 2024-07-20 DIAGNOSIS — M5417 Radiculopathy, lumbosacral region: Secondary | ICD-10-CM | POA: Diagnosis not present

## 2024-07-20 DIAGNOSIS — G8929 Other chronic pain: Secondary | ICD-10-CM | POA: Diagnosis not present

## 2024-07-20 DIAGNOSIS — G600 Hereditary motor and sensory neuropathy: Secondary | ICD-10-CM | POA: Diagnosis not present

## 2024-07-20 DIAGNOSIS — F17290 Nicotine dependence, other tobacco product, uncomplicated: Secondary | ICD-10-CM | POA: Diagnosis not present

## 2024-07-20 DIAGNOSIS — M5416 Radiculopathy, lumbar region: Secondary | ICD-10-CM | POA: Diagnosis not present

## 2024-08-31 DIAGNOSIS — M79604 Pain in right leg: Secondary | ICD-10-CM | POA: Diagnosis not present

## 2024-08-31 DIAGNOSIS — M5416 Radiculopathy, lumbar region: Secondary | ICD-10-CM | POA: Diagnosis not present

## 2024-08-31 DIAGNOSIS — G8929 Other chronic pain: Secondary | ICD-10-CM | POA: Diagnosis not present

## 2024-08-31 DIAGNOSIS — G600 Hereditary motor and sensory neuropathy: Secondary | ICD-10-CM | POA: Diagnosis not present

## 2024-08-31 DIAGNOSIS — Z79899 Other long term (current) drug therapy: Secondary | ICD-10-CM | POA: Diagnosis not present

## 2024-08-31 DIAGNOSIS — M545 Low back pain, unspecified: Secondary | ICD-10-CM | POA: Diagnosis not present

## 2024-09-28 DIAGNOSIS — R269 Unspecified abnormalities of gait and mobility: Secondary | ICD-10-CM | POA: Diagnosis not present

## 2024-09-28 DIAGNOSIS — Z0000 Encounter for general adult medical examination without abnormal findings: Secondary | ICD-10-CM | POA: Diagnosis not present

## 2024-09-28 DIAGNOSIS — R399 Unspecified symptoms and signs involving the genitourinary system: Secondary | ICD-10-CM | POA: Diagnosis not present

## 2024-09-28 DIAGNOSIS — F32A Depression, unspecified: Secondary | ICD-10-CM | POA: Diagnosis not present

## 2024-09-28 DIAGNOSIS — M549 Dorsalgia, unspecified: Secondary | ICD-10-CM | POA: Diagnosis not present

## 2024-09-28 DIAGNOSIS — R338 Other retention of urine: Secondary | ICD-10-CM | POA: Diagnosis not present

## 2024-09-28 DIAGNOSIS — N401 Enlarged prostate with lower urinary tract symptoms: Secondary | ICD-10-CM | POA: Diagnosis not present

## 2024-09-28 DIAGNOSIS — J3489 Other specified disorders of nose and nasal sinuses: Secondary | ICD-10-CM | POA: Diagnosis not present

## 2024-10-06 DIAGNOSIS — M5417 Radiculopathy, lumbosacral region: Secondary | ICD-10-CM | POA: Diagnosis not present

## 2024-10-18 DIAGNOSIS — M5417 Radiculopathy, lumbosacral region: Secondary | ICD-10-CM | POA: Diagnosis not present
